# Patient Record
Sex: Male | Born: 1960
Health system: Southern US, Community
[De-identification: ages and names within clinical notes are randomized; demographics above are authoritative.]

## PROBLEM LIST (undated history)

## (undated) DIAGNOSIS — R011 Cardiac murmur, unspecified: Secondary | ICD-10-CM

## (undated) DIAGNOSIS — I4892 Unspecified atrial flutter: Secondary | ICD-10-CM

## (undated) DIAGNOSIS — I429 Cardiomyopathy, unspecified: Secondary | ICD-10-CM

## (undated) DIAGNOSIS — I509 Heart failure, unspecified: Secondary | ICD-10-CM

## (undated) DIAGNOSIS — J302 Other seasonal allergic rhinitis: Secondary | ICD-10-CM

## (undated) DIAGNOSIS — I10 Essential (primary) hypertension: Secondary | ICD-10-CM

## (undated) DIAGNOSIS — I471 Supraventricular tachycardia, unspecified: Secondary | ICD-10-CM

## (undated) HISTORY — DX: Heart failure, unspecified: I50.9

## (undated) HISTORY — DX: Supraventricular tachycardia: I47.1

## (undated) HISTORY — PX: SYNOVECTOMY: SHX133

## (undated) HISTORY — DX: Supraventricular tachycardia, unspecified: I47.10

## (undated) HISTORY — DX: Cardiomyopathy, unspecified: I42.9

## (undated) HISTORY — DX: Essential (primary) hypertension: I10

## (undated) HISTORY — DX: Other seasonal allergic rhinitis: J30.2

## (undated) HISTORY — DX: Unspecified atrial flutter: I48.92

## (undated) HISTORY — DX: Cardiac murmur, unspecified: R01.1

---

## 1998-01-26 ENCOUNTER — Ambulatory Visit (HOSPITAL_COMMUNITY): Admission: RE | Admit: 1998-01-26 | Discharge: 1998-01-26 | Payer: Self-pay | Admitting: *Deleted

## 2005-05-20 ENCOUNTER — Encounter: Admission: RE | Admit: 2005-05-20 | Discharge: 2005-05-20 | Payer: Self-pay | Admitting: Rheumatology

## 2005-06-14 ENCOUNTER — Ambulatory Visit (HOSPITAL_BASED_OUTPATIENT_CLINIC_OR_DEPARTMENT_OTHER): Admission: RE | Admit: 2005-06-14 | Discharge: 2005-06-14 | Payer: Self-pay | Admitting: Orthopedic Surgery

## 2005-06-14 ENCOUNTER — Ambulatory Visit (HOSPITAL_COMMUNITY): Admission: RE | Admit: 2005-06-14 | Discharge: 2005-06-14 | Payer: Self-pay | Admitting: Orthopedic Surgery

## 2010-12-16 ENCOUNTER — Encounter: Payer: Self-pay | Admitting: Internal Medicine

## 2010-12-16 ENCOUNTER — Encounter: Payer: Self-pay | Admitting: *Deleted

## 2010-12-19 ENCOUNTER — Encounter: Payer: Self-pay | Admitting: Internal Medicine

## 2010-12-19 ENCOUNTER — Ambulatory Visit (INDEPENDENT_AMBULATORY_CARE_PROVIDER_SITE_OTHER): Payer: PRIVATE HEALTH INSURANCE | Admitting: Internal Medicine

## 2010-12-19 DIAGNOSIS — R609 Edema, unspecified: Secondary | ICD-10-CM

## 2010-12-19 DIAGNOSIS — R0609 Other forms of dyspnea: Secondary | ICD-10-CM

## 2010-12-19 DIAGNOSIS — R0989 Other specified symptoms and signs involving the circulatory and respiratory systems: Secondary | ICD-10-CM

## 2010-12-19 DIAGNOSIS — I10 Essential (primary) hypertension: Secondary | ICD-10-CM

## 2010-12-19 NOTE — Patient Instructions (Signed)
Lab work today We will call you with results.  Your physician has requested that you have an echocardiogram. Echocardiography is a painless test that uses sound waves to create images of your heart. It provides your doctor with information about the size and shape of your heart and how well your heart's chambers and valves are working. This procedure takes approximately one hour. There are no restrictions for this procedure.   

## 2010-12-20 DIAGNOSIS — R609 Edema, unspecified: Secondary | ICD-10-CM | POA: Insufficient documentation

## 2010-12-20 DIAGNOSIS — I10 Essential (primary) hypertension: Secondary | ICD-10-CM | POA: Insufficient documentation

## 2010-12-20 LAB — CBC WITH DIFFERENTIAL/PLATELET
Basophils Absolute: 0.1 K/uL (ref 0.0–0.1)
Basophils Relative: 0.4 % (ref 0.0–3.0)
Eosinophils Absolute: 0.1 K/uL (ref 0.0–0.7)
Eosinophils Relative: 1 % (ref 0.0–5.0)
HCT: 47.8 % (ref 39.0–52.0)
Hemoglobin: 16.4 g/dL (ref 13.0–17.0)
Lymphocytes Relative: 45.3 % (ref 12.0–46.0)
Lymphs Abs: 6.1 K/uL — ABNORMAL HIGH (ref 0.7–4.0)
MCHC: 34.2 g/dL (ref 30.0–36.0)
MCV: 97.3 fl (ref 78.0–100.0)
Monocytes Absolute: 1 K/uL (ref 0.1–1.0)
Monocytes Relative: 7.8 % (ref 3.0–12.0)
Neutro Abs: 6.1 K/uL (ref 1.4–7.7)
Neutrophils Relative %: 45.5 % (ref 43.0–77.0)
Platelets: 120 K/uL — ABNORMAL LOW (ref 150.0–400.0)
RBC: 4.92 Mil/uL (ref 4.22–5.81)
RDW: 15.5 % — ABNORMAL HIGH (ref 11.5–14.6)
WBC: 13.4 K/uL — ABNORMAL HIGH (ref 4.5–10.5)

## 2010-12-20 LAB — BASIC METABOLIC PANEL WITH GFR
BUN: 15 mg/dL (ref 6–23)
CO2: 26 meq/L (ref 19–32)
Calcium: 8.6 mg/dL (ref 8.4–10.5)
Chloride: 98 meq/L (ref 96–112)
Creatinine, Ser: 1.4 mg/dL (ref 0.4–1.5)
GFR: 69.78 mL/min
Glucose, Bld: 125 mg/dL — ABNORMAL HIGH (ref 70–99)
Potassium: 4 meq/L (ref 3.5–5.1)
Sodium: 138 meq/L (ref 135–145)

## 2010-12-20 LAB — BRAIN NATRIURETIC PEPTIDE: Pro B Natriuretic peptide (BNP): 1541 pg/mL — ABNORMAL HIGH (ref 0.0–100.0)

## 2010-12-20 LAB — APTT: aPTT: 23.8 s (ref 21.7–28.8)

## 2010-12-20 LAB — PROTIME-INR
INR: 1.6 ratio — ABNORMAL HIGH (ref 0.8–1.0)
Prothrombin Time: 17 s — ABNORMAL HIGH (ref 10.2–12.4)

## 2010-12-20 NOTE — Progress Notes (Signed)
HPI Patient is a 50 year old who was referred for evaluation of SOB and edema The patient has no known cardiac problems.  He said he was doing OK until January 19th 2012.  He had been travelling to [place].  A few days after he came back home "all hell broke loose".  He couln't eat.  He had a lot of abdomenal discomfort.  Took home remedies on his own (PeptoBismal, etc)  He couldn't work out, couldn't stretch, It hurt to bend at the waist.  Seen by hopper.  Tried Omeprazole then Tums then Nexium.  Marland Kitchen  Abdomen was hurting daily, "it stuck with me".  He was not sleeping well.  Became SOB, Felt heavy in chest.  Dragging, tired.  No appetite.  Developed edema in legs.  Seen by Dr. Alwyn Ren.  Found to have an irregular HB.  Referred. Note that he was started on Lasix Thursday.  LE swelling is significantly improved. No Known Allergies  Current Outpatient Prescriptions  Medication Sig Dispense Refill  . cetirizine (ZYRTEC) 10 MG tablet Take 10 mg by mouth daily.        Marland Kitchen esomeprazole (NEXIUM) 40 MG capsule Take 40 mg by mouth daily before breakfast.        . furosemide (LASIX) 40 MG tablet Take 40 mg by mouth daily.        . metFORMIN (GLUMETZA) 500 MG (MOD) 24 hr tablet Take 500 mg by mouth 2 (two) times daily with a meal.         Past Medical History  Diagnosis Date  . CHF (congestive heart failure)   . Edema   . HTN (hypertension)     Past Surgical History  Procedure Date  . Synovectomy     No family history on file.  History   Social History  . Marital Status: Single    Spouse Name: N/A    Number of Children: N/A  . Years of Education: N/A   Occupational History  . Not on file.   Social History Main Topics  . Smoking status: Never Smoker   . Smokeless tobacco: Not on file  . Alcohol Use: Yes     occasionally  . Drug Use: No  . Sexually Active: Not on file   Other Topics Concern  . Not on file   Social History Narrative  . No narrative on file    Review of Systems:  All  systems reviewed.  They are negative to the above problem except as previously stated.  Vital Signs: BP 104/88  Pulse 113  Resp 18  Ht 6\' 2"  (1.88 m)  Wt 225 lb 1.9 oz (102.114 kg)  BMI 28.90 kg/m2  Physical Exam Patient is in NAD at rest. HEENT:  Normocephalic, atraumatic. EOMI, PERRLA.  Neck: Mild ly increased JVP.   No thyromegaly. No bruits.  Lungs:Occasional rale at bases. Heart: Regular rate and rhythm. Normal S1, S2. No S3.   No significant murmurs. PMI not displaced.  Abdomen:  Supple, nontender. Normal bowel sounds. No masses. No hepatomegaly.  Extremities:   Good distal pulses throughout. 1 to 2 + LE edema. Musculoskeletal :moving all extremities.  Neuro:   alert and oriented x3.  CN II-XII grossly intact.  EKG: Sinus tachycardia.  113 bpm.  QTc 500.  Assessment and Plan:

## 2010-12-21 ENCOUNTER — Ambulatory Visit (HOSPITAL_COMMUNITY): Payer: PRIVATE HEALTH INSURANCE | Attending: Internal Medicine | Admitting: Radiology

## 2010-12-21 DIAGNOSIS — R0989 Other specified symptoms and signs involving the circulatory and respiratory systems: Secondary | ICD-10-CM | POA: Insufficient documentation

## 2010-12-21 DIAGNOSIS — R0609 Other forms of dyspnea: Secondary | ICD-10-CM | POA: Insufficient documentation

## 2010-12-21 DIAGNOSIS — R609 Edema, unspecified: Secondary | ICD-10-CM

## 2010-12-22 ENCOUNTER — Encounter: Payer: Self-pay | Admitting: Internal Medicine

## 2010-12-26 ENCOUNTER — Telehealth: Payer: Self-pay | Admitting: Internal Medicine

## 2010-12-26 NOTE — Telephone Encounter (Signed)
See note attached to echo report 

## 2010-12-26 NOTE — Telephone Encounter (Signed)
Pt still in pain and wants the results of echo

## 2010-12-26 NOTE — Telephone Encounter (Signed)
LMOM for call back. 

## 2010-12-26 NOTE — Telephone Encounter (Signed)
Called patient with echo results.  See notes attached to the echo.

## 2010-12-27 ENCOUNTER — Telehealth: Payer: Self-pay | Admitting: Cardiology

## 2010-12-27 ENCOUNTER — Other Ambulatory Visit (INDEPENDENT_AMBULATORY_CARE_PROVIDER_SITE_OTHER): Payer: Self-pay | Admitting: *Deleted

## 2010-12-27 DIAGNOSIS — Z01810 Encounter for preprocedural cardiovascular examination: Secondary | ICD-10-CM

## 2010-12-27 DIAGNOSIS — I428 Other cardiomyopathies: Secondary | ICD-10-CM

## 2010-12-27 LAB — CBC WITH DIFFERENTIAL/PLATELET
Basophils Absolute: 0 10*3/uL (ref 0.0–0.1)
Basophils Relative: 0.4 % (ref 0.0–3.0)
Eosinophils Absolute: 0.1 10*3/uL (ref 0.0–0.7)
Eosinophils Relative: 0.6 % (ref 0.0–5.0)
HCT: 42.6 % (ref 39.0–52.0)
Hemoglobin: 14.5 g/dL (ref 13.0–17.0)
Lymphocytes Relative: 25.2 % (ref 12.0–46.0)
Lymphs Abs: 2.5 10*3/uL (ref 0.7–4.0)
MCHC: 34 g/dL (ref 30.0–36.0)
MCV: 96.3 fl (ref 78.0–100.0)
Monocytes Absolute: 1.2 10*3/uL — ABNORMAL HIGH (ref 0.1–1.0)
Monocytes Relative: 11.9 % (ref 3.0–12.0)
Neutro Abs: 6.2 10*3/uL (ref 1.4–7.7)
Neutrophils Relative %: 61.9 % (ref 43.0–77.0)
Platelets: 200 10*3/uL (ref 150.0–400.0)
RBC: 4.43 Mil/uL (ref 4.22–5.81)
RDW: 15.3 % — ABNORMAL HIGH (ref 11.5–14.6)
WBC: 10 10*3/uL (ref 4.5–10.5)

## 2010-12-27 LAB — BASIC METABOLIC PANEL
BUN: 29 mg/dL — ABNORMAL HIGH (ref 6–23)
CO2: 27 mEq/L (ref 19–32)
Calcium: 7.9 mg/dL — ABNORMAL LOW (ref 8.4–10.5)
Chloride: 99 mEq/L (ref 96–112)
Creatinine, Ser: 1.9 mg/dL — ABNORMAL HIGH (ref 0.4–1.5)
GFR: 49.55 mL/min — ABNORMAL LOW (ref >60.00)
Glucose, Bld: 138 mg/dL — ABNORMAL HIGH (ref 70–99)
Potassium: 3.7 mEq/L (ref 3.5–5.1)
Sodium: 138 mEq/L (ref 135–145)

## 2010-12-27 LAB — PROTIME-INR
INR: 1.8 ratio — ABNORMAL HIGH (ref 0.8–1.0)
Prothrombin Time: 18.9 s — ABNORMAL HIGH (ref 10.2–12.4)

## 2010-12-27 LAB — TSH: TSH: 2.82 u[IU]/mL (ref 0.35–5.50)

## 2010-12-27 LAB — BRAIN NATRIURETIC PEPTIDE: Pro B Natriuretic peptide (BNP): 1525 pg/mL — ABNORMAL HIGH (ref 0.0–100.0)

## 2010-12-27 LAB — APTT: aPTT: 24.7 s (ref 21.7–28.8)

## 2010-12-27 NOTE — Telephone Encounter (Signed)
Per Dr. Tenny Craw, cath rescheduled for Friday @ 11:30 am due to elevated creatinine. He will repeat blood work Thursday. Patient is aware of blood work & to hold Lasix.

## 2010-12-29 ENCOUNTER — Other Ambulatory Visit (INDEPENDENT_AMBULATORY_CARE_PROVIDER_SITE_OTHER): Payer: Self-pay | Admitting: Internal Medicine

## 2010-12-29 ENCOUNTER — Other Ambulatory Visit (INDEPENDENT_AMBULATORY_CARE_PROVIDER_SITE_OTHER): Payer: Self-pay | Admitting: *Deleted

## 2010-12-29 DIAGNOSIS — Z01810 Encounter for preprocedural cardiovascular examination: Secondary | ICD-10-CM

## 2010-12-29 LAB — BASIC METABOLIC PANEL
BUN: 25 mg/dL — ABNORMAL HIGH (ref 6–23)
CO2: 27 mEq/L (ref 19–32)
Calcium: 7.9 mg/dL — ABNORMAL LOW (ref 8.4–10.5)
Chloride: 98 mEq/L (ref 96–112)
Creatinine, Ser: 1.2 mg/dL (ref 0.4–1.5)
GFR: 82.67 mL/min (ref >60.00)
Glucose, Bld: 151 mg/dL — ABNORMAL HIGH (ref 70–99)
Potassium: 3.8 mEq/L (ref 3.5–5.1)
Sodium: 138 mEq/L (ref 135–145)

## 2010-12-30 ENCOUNTER — Inpatient Hospital Stay (HOSPITAL_BASED_OUTPATIENT_CLINIC_OR_DEPARTMENT_OTHER)
Admission: RE | Admit: 2010-12-30 | Discharge: 2010-12-30 | Disposition: A | Discharge status: Hospice | Payer: Self-pay | Source: Ambulatory Visit | Attending: Internal Medicine | Admitting: Internal Medicine

## 2010-12-30 ENCOUNTER — Inpatient Hospital Stay (HOSPITAL_COMMUNITY)
Admission: AD | Admit: 2010-12-30 | Discharge: 2011-01-06 | DRG: 251 | Disposition: A | Discharge status: Home / Self Care | Payer: Self-pay | Source: Other Acute Inpatient Hospital | Attending: Internal Medicine | Admitting: Internal Medicine

## 2010-12-30 ENCOUNTER — Ambulatory Visit (HOSPITAL_COMMUNITY)
Admission: RE | Admit: 2010-12-30 | Discharge: 2010-12-30 | Disposition: A | Discharge status: Home / Self Care | Payer: Self-pay | Source: Ambulatory Visit | Attending: Internal Medicine | Admitting: Internal Medicine

## 2010-12-30 DIAGNOSIS — M25469 Effusion, unspecified knee: Secondary | ICD-10-CM | POA: Diagnosis not present

## 2010-12-30 DIAGNOSIS — R000 Tachycardia, unspecified: Secondary | ICD-10-CM | POA: Insufficient documentation

## 2010-12-30 DIAGNOSIS — I509 Heart failure, unspecified: Secondary | ICD-10-CM

## 2010-12-30 DIAGNOSIS — I10 Essential (primary) hypertension: Secondary | ICD-10-CM | POA: Insufficient documentation

## 2010-12-30 DIAGNOSIS — E119 Type 2 diabetes mellitus without complications: Secondary | ICD-10-CM | POA: Diagnosis present

## 2010-12-30 DIAGNOSIS — R9389 Abnormal findings on diagnostic imaging of other specified body structures: Secondary | ICD-10-CM | POA: Insufficient documentation

## 2010-12-30 DIAGNOSIS — I059 Rheumatic mitral valve disease, unspecified: Secondary | ICD-10-CM | POA: Diagnosis present

## 2010-12-30 DIAGNOSIS — I428 Other cardiomyopathies: Secondary | ICD-10-CM | POA: Insufficient documentation

## 2010-12-30 DIAGNOSIS — I498 Other specified cardiac arrhythmias: Secondary | ICD-10-CM | POA: Diagnosis present

## 2010-12-30 DIAGNOSIS — I5023 Acute on chronic systolic (congestive) heart failure: Principal | ICD-10-CM | POA: Diagnosis present

## 2010-12-30 LAB — POCT I-STAT 3, VENOUS BLOOD GAS (G3P V)
Acid-Base Excess: 1 mmol/L (ref 0.0–2.0)
Acid-Base Excess: 1 mmol/L (ref 0.0–2.0)
Acid-Base Excess: 1 mmol/L (ref 0.0–2.0)
Bicarbonate: 25.7 mEq/L — ABNORMAL HIGH (ref 20.0–24.0)
Bicarbonate: 25.8 mEq/L — ABNORMAL HIGH (ref 20.0–24.0)
Bicarbonate: 25.3 mEq/L — ABNORMAL HIGH (ref 20.0–24.0)
O2 Saturation: 65 %
O2 Saturation: 67 %
O2 Saturation: 67 %
TCO2: 27 mmol/L (ref 0–100)
TCO2: 27 mmol/L (ref 0–100)
TCO2: 27 mmol/L (ref 0–100)
pCO2, Ven: 39.8 mmHg — ABNORMAL LOW (ref 45.0–50.0)
pCO2, Ven: 40.1 mmHg — ABNORMAL LOW (ref 45.0–50.0)
pCO2, Ven: 39.9 mmHg — ABNORMAL LOW (ref 45.0–50.0)
pH, Ven: 7.417 — ABNORMAL HIGH (ref 7.250–7.300)
pH, Ven: 7.418 — ABNORMAL HIGH (ref 7.250–7.300)
pH, Ven: 7.411 — ABNORMAL HIGH (ref 7.250–7.300)
pO2, Ven: 33 mmHg (ref 30.0–45.0)
pO2, Ven: 34 mmHg (ref 30.0–45.0)
pO2, Ven: 34 mmHg (ref 30.0–45.0)

## 2010-12-30 LAB — POCT I-STAT 3, ART BLOOD GAS (G3+)
Acid-Base Excess: 1 mmol/L (ref 0.0–2.0)
Bicarbonate: 24.5 mEq/L — ABNORMAL HIGH (ref 20.0–24.0)
O2 Saturation: 95 %
TCO2: 26 mmol/L (ref 0–100)
pCO2 arterial: 35.8 mmHg (ref 35.0–45.0)
pH, Arterial: 7.444 (ref 7.350–7.450)
pO2, Arterial: 70 mmHg — ABNORMAL LOW (ref 80.0–100.0)

## 2010-12-30 LAB — GLUCOSE, CAPILLARY
Glucose-Capillary: 181 mg/dL — ABNORMAL HIGH (ref 70–99)
Glucose-Capillary: 189 mg/dL — ABNORMAL HIGH (ref 70–99)

## 2010-12-30 LAB — MRSA PCR SCREENING: MRSA by PCR: NEGATIVE

## 2010-12-30 LAB — POCT I-STAT GLUCOSE
Glucose, Bld: 133 mg/dL — ABNORMAL HIGH (ref 70–99)
Operator id: 122531

## 2010-12-30 LAB — PROTIME-INR
INR: 1.67 — ABNORMAL HIGH (ref 0.00–1.49)
Prothrombin Time: 19.9 seconds — ABNORMAL HIGH (ref 11.6–15.2)

## 2010-12-30 NOTE — Assessment & Plan Note (Signed)
Echo with severe LV dysfunction, significant MR. Would recommend cardiac cath to rule out CAD.  (R/L).  Discussed with patient risks/benefits.  He understands and agrees to proceed.

## 2010-12-30 NOTE — Assessment & Plan Note (Signed)
BP is OK on check  Will need to be followed.

## 2010-12-31 DIAGNOSIS — I5023 Acute on chronic systolic (congestive) heart failure: Secondary | ICD-10-CM

## 2010-12-31 LAB — CBC
HCT: 39.3 % (ref 39.0–52.0)
MCH: 32 pg (ref 26.0–34.0)
MCHC: 37.9 g/dL — ABNORMAL HIGH (ref 30.0–36.0)
MCV: 84.3 fL (ref 78.0–100.0)
Platelets: 272 10*3/uL (ref 150–400)
RDW: 14.6 % (ref 11.5–15.5)
WBC: 7.8 10*3/uL (ref 4.0–10.5)

## 2010-12-31 LAB — COMPREHENSIVE METABOLIC PANEL
ALT: 85 U/L — ABNORMAL HIGH (ref 0–53)
AST: 61 U/L — ABNORMAL HIGH (ref 0–37)
Albumin: 2.2 g/dL — ABNORMAL LOW (ref 3.5–5.2)
Alkaline Phosphatase: 232 U/L — ABNORMAL HIGH (ref 39–117)
BUN: 19 mg/dL (ref 6–23)
CO2: 28 mEq/L (ref 19–32)
Calcium: 8.1 mg/dL — ABNORMAL LOW (ref 8.4–10.5)
Chloride: 100 mEq/L (ref 96–112)
Creatinine, Ser: 1.01 mg/dL (ref 0.4–1.5)
GFR calc Af Amer: >60 mL/min (ref >60)
GFR calc non Af Amer: >60 mL/min (ref >60)
Glucose, Bld: 139 mg/dL — ABNORMAL HIGH (ref 70–99)
Potassium: 3.2 mEq/L — ABNORMAL LOW (ref 3.5–5.1)
Sodium: 137 mEq/L (ref 135–145)
Total Bilirubin: 2.3 mg/dL — ABNORMAL HIGH (ref 0.3–1.2)
Total Protein: 4.8 g/dL — ABNORMAL LOW (ref 6.0–8.3)

## 2010-12-31 LAB — PRO B NATRIURETIC PEPTIDE: Pro B Natriuretic peptide (BNP): 3889 pg/mL — ABNORMAL HIGH (ref 0–125)

## 2010-12-31 LAB — GLUCOSE, CAPILLARY
Glucose-Capillary: 184 mg/dL — ABNORMAL HIGH (ref 70–99)
Glucose-Capillary: 194 mg/dL — ABNORMAL HIGH (ref 70–99)
Glucose-Capillary: 190 mg/dL — ABNORMAL HIGH (ref 70–99)

## 2010-12-31 LAB — MAGNESIUM: Magnesium: 1.7 mg/dL (ref 1.5–2.5)

## 2011-01-01 ENCOUNTER — Inpatient Hospital Stay (HOSPITAL_COMMUNITY): Payer: Self-pay

## 2011-01-01 LAB — BASIC METABOLIC PANEL
BUN: 14 mg/dL (ref 6–23)
Calcium: 8 mg/dL — ABNORMAL LOW (ref 8.4–10.5)
Creatinine, Ser: 0.74 mg/dL (ref 0.4–1.5)
GFR calc non Af Amer: >60 mL/min (ref >60)
Glucose, Bld: 124 mg/dL — ABNORMAL HIGH (ref 70–99)
Potassium: 3.6 mEq/L (ref 3.5–5.1)

## 2011-01-01 LAB — CBC
HCT: 35.9 % — ABNORMAL LOW (ref 39.0–52.0)
MCHC: 37.9 g/dL — ABNORMAL HIGH (ref 30.0–36.0)
Platelets: 271 10*3/uL (ref 150–400)
RDW: 14.6 % (ref 11.5–15.5)
WBC: 8.6 10*3/uL (ref 4.0–10.5)

## 2011-01-01 LAB — CARBOXYHEMOGLOBIN
Carboxyhemoglobin: 1.6 % — ABNORMAL HIGH (ref 0.5–1.5)
O2 Saturation: 70.8 %

## 2011-01-01 LAB — GLUCOSE, CAPILLARY: Glucose-Capillary: 202 mg/dL — ABNORMAL HIGH (ref 70–99)

## 2011-01-02 ENCOUNTER — Inpatient Hospital Stay (HOSPITAL_COMMUNITY): Payer: Self-pay

## 2011-01-02 DIAGNOSIS — I471 Supraventricular tachycardia: Secondary | ICD-10-CM

## 2011-01-02 LAB — CBC
HCT: 36.3 % — ABNORMAL LOW (ref 39.0–52.0)
MCH: 32.2 pg (ref 26.0–34.0)
MCV: 85.2 fL (ref 78.0–100.0)
RBC: 4.26 MIL/uL (ref 4.22–5.81)
WBC: 10 10*3/uL (ref 4.0–10.5)

## 2011-01-02 LAB — URINALYSIS, ROUTINE W REFLEX MICROSCOPIC
Glucose, UA: NEGATIVE mg/dL
Leukocytes, UA: NEGATIVE
Nitrite: NEGATIVE
Specific Gravity, Urine: 1.01 (ref 1.005–1.030)
pH: 6 (ref 5.0–8.0)

## 2011-01-02 LAB — GLUCOSE, CAPILLARY
Glucose-Capillary: 150 mg/dL — ABNORMAL HIGH (ref 70–99)
Glucose-Capillary: 192 mg/dL — ABNORMAL HIGH (ref 70–99)
Glucose-Capillary: 131 mg/dL — ABNORMAL HIGH (ref 70–99)

## 2011-01-02 LAB — BASIC METABOLIC PANEL
CO2: 28 mEq/L (ref 19–32)
Chloride: 101 mEq/L (ref 96–112)
Creatinine, Ser: 0.89 mg/dL (ref 0.4–1.5)
GFR calc Af Amer: >60 mL/min (ref >60)
Glucose, Bld: 138 mg/dL — ABNORMAL HIGH (ref 70–99)
Sodium: 136 mEq/L (ref 135–145)

## 2011-01-02 LAB — URINE MICROSCOPIC-ADD ON

## 2011-01-02 LAB — CARBOXYHEMOGLOBIN: Total hemoglobin: 13.8 g/dL (ref 13.5–18.0)

## 2011-01-03 LAB — BASIC METABOLIC PANEL
CO2: 30 mEq/L (ref 19–32)
Chloride: 99 mEq/L (ref 96–112)
Glucose, Bld: 134 mg/dL — ABNORMAL HIGH (ref 70–99)
Potassium: 4.4 mEq/L (ref 3.5–5.1)
Sodium: 136 mEq/L (ref 135–145)

## 2011-01-03 LAB — CARBOXYHEMOGLOBIN
Carboxyhemoglobin: 2.1 % — ABNORMAL HIGH (ref 0.5–1.5)
Methemoglobin: 0.3 % (ref 0.0–1.5)
O2 Saturation: 75.4 %

## 2011-01-03 LAB — GLUCOSE, CAPILLARY
Glucose-Capillary: 220 mg/dL — ABNORMAL HIGH (ref 70–99)
Glucose-Capillary: 249 mg/dL — ABNORMAL HIGH (ref 70–99)

## 2011-01-04 LAB — GLUCOSE, CAPILLARY
Glucose-Capillary: 130 mg/dL — ABNORMAL HIGH (ref 70–99)
Glucose-Capillary: 318 mg/dL — ABNORMAL HIGH (ref 70–99)

## 2011-01-04 LAB — BASIC METABOLIC PANEL
BUN: 16 mg/dL (ref 6–23)
Chloride: 99 mEq/L (ref 96–112)
Glucose, Bld: 149 mg/dL — ABNORMAL HIGH (ref 70–99)
Potassium: 4.6 mEq/L (ref 3.5–5.1)

## 2011-01-04 LAB — CARBOXYHEMOGLOBIN: Methemoglobin: 0.8 % (ref 0.0–1.5)

## 2011-01-04 LAB — URIC ACID: Uric Acid, Serum: 7.3 mg/dL (ref 4.0–7.8)

## 2011-01-04 NOTE — Cardiovascular Report (Signed)
NAMEKWESI, John Pham              ACCOUNT NO.:  [id number]  MEDICAL RECORD NO.:  [id number]           PATIENT TYPE:  O  LOCATION:  EKG                          FACILITY:  [facility]  PHYSICIAN:  Bevelyn Buckles. Celine Dishman, MDDATE OF BIRTH:  05-Sep-1960  DATE OF PROCEDURE:  12/30/2010 DATE OF DISCHARGE:                           CARDIAC CATHETERIZATION   PRIMARY CARE PHYSICIAN:  Titus Dubin. Alwyn Ren, MD, FACP, FCCP  CARDIOLOGIST:  Pricilla Riffle, MD, [facility]  PATIENT IDENTIFICATION:  John Pham is a 50 year old male with a history of hypertension and diabetes.  He has had progressive heart failure symptoms over the past few weeks.  Echocardiogram showed significant biventricular dysfunction with LV ejection fraction of 25%.  He is brought in today for an outpatient cardiac catheterization.  PROCEDURES PERFORMED: 1. Right heart catheterization. 2..  Selective coronary angiography. 1. Left heart catheterization. 2. Left ventriculogram.  DESCRIPTION OF PROCEDURE:  The risks and indications were explained. Consent was signed and placed on the chart.  At the beginning of procedure, the patient was noted to have a heart rate of 145 beats per minute.  It was regular.  Prior to prepping him for the catheterization, we got 12-lead EKG and a rhythm strip and appeared to be a sinus tach versus atrial tachycardia.  We gave him 6 mg of adenosine with no effect.  We then repeated with 12 mg adenosine and still no effect.  I have reviewed the tracings with Dr. Johney Frame who agreed that it is likely sinus tachycardia versus atrial tachycardia. We then proceeded with the catheterization.  The right groin area was prepped and draped in a routine sterile fashion, anesthetized 1% local lidocaine.  A 4-French arterial sheath and a 7-French venous sheath were placed.  Standard catheters including JL-4, 3-DRC, angled pigtail, and a Swan-Ganz catheter were used.  There were no apparent complications.  All catheter  exchanges were made over a wire.  HEMODYNAMICS:  Right atrial pressure mean of 11, RV pressure 38/3 with an EDP of 2.  PA pressure 39/25 with a mean of 31.  Pulmonary capillary wedge pressure mean of 16.  Fick cardiac output was 5.0 and cardiac index was 2.2.  Thermodilution cardiac output was 3.4, index was 1.5. Central aortic pressure 98/84 with a mean of 90.  LV pressure 103/20 with an EDP of 20.  There was no aortic stenosis.  Pulmonary vascular resistance was 4.5 Woods units.  Pulmonary artery saturation was 65 and 67%.  Femoral artery saturation was 95%.  SVC saturation was 67%.  Left main was normal.  LAD was a moderate-sized vessel, gave off a small proximal diagonal and large mid diagonal and it was angiographically normal.  Left circumflex was a large codominant vessel, gave off a large OM-1, small OM-2, and two posterolaterals and question of a small PDA.  Right coronary artery was a large dominant vessel, gave off a PDA and a posterolateral and was angiographically normal.  Left ventriculogram done in the RAO position showed a markedly dilated ventricle with an EF of 10%.  There was severe global hypokinesis.  ASSESSMENT: 1. Severe nonischemic cardiomyopathy with low output heart failure,  but based on thermodilution. 2. Significant tachycardia.  I suspect this is sinus tachycardia     versus atrial tachycardia.  I have reviewed this with Dr. Johney Frame.     He did not have any response to adenosine. 3. No evidence of intracardiac shunting. 4. Normal coronary arteries.  PLAN/DISCUSSION:  As above, I think Mr. Sonnenberg tachycardia is reactive, sinus tach secondary to low output heart failure, but I cannot exclude a tachycardic-induced cardiomyopathy secondary to atrial tach with persistently increased heart rates.  At this point, we will admit him to 2900.  We will start milrinone and digoxin as well as low-dose ACE inhibitor.  Over the next day or two, proceed  with gentle initiation of beta-blocker.  We may need EP to see him if his heart rate is not decreasing.  We would hold metformin for now.     Bevelyn Buckles. Lin Hackmann, MD     DRB/MEDQ  D:  12/30/2010  T:  12/30/2010  Job:  [id number]  Electronically Signed by Arvilla Meres MD on 01/04/2011 09:41:58 PM

## 2011-01-05 ENCOUNTER — Inpatient Hospital Stay (HOSPITAL_COMMUNITY): Payer: Self-pay

## 2011-01-05 LAB — BASIC METABOLIC PANEL
CO2: 31 mEq/L (ref 19–32)
Chloride: 98 mEq/L (ref 96–112)
Glucose, Bld: 136 mg/dL — ABNORMAL HIGH (ref 70–99)
Potassium: 4.1 mEq/L (ref 3.5–5.1)
Sodium: 135 mEq/L (ref 135–145)

## 2011-01-05 LAB — GLUCOSE, CAPILLARY
Glucose-Capillary: 311 mg/dL — ABNORMAL HIGH (ref 70–99)
Glucose-Capillary: 250 mg/dL — ABNORMAL HIGH (ref 70–99)
Glucose-Capillary: 296 mg/dL — ABNORMAL HIGH (ref 70–99)

## 2011-01-05 LAB — CARBOXYHEMOGLOBIN: O2 Saturation: 56.5 %

## 2011-01-05 NOTE — Op Note (Signed)
NAMEIZZY, COURVILLE              ACCOUNT NO.:  [id number]  MEDICAL RECORD NO.:  [id number]           PATIENT TYPE:  I  LOCATION:  2917                         FACILITY:  [facility]  PHYSICIAN:  Doylene Canning. Ladona Ridgel, MD    DATE OF BIRTH:  11-May-1961  DATE OF PROCEDURE:  01/02/2011 DATE OF DISCHARGE:                              OPERATIVE REPORT   PROCEDURE PERFORMED:  Electrophysiologic study and RF catheter ablation of multiple supraventricular tachycardias including 2 high right atrial tachycardias, as well as atrioventricular nodal reentrant tachycardia.  INTRODUCTION:  The patient is a 50 year old man who was found to have congestive heart failure and a nonischemic cardiomyopathy questionable tachycardia mediated.  He has had recurrent episodes of SVT at rates between 120-150 beats per minute.  Typically, these appeared to have been a long RP tachycardia.  He is referred now for catheter ablation.  PROCEDURE:  After informed consent was obtained, the patient was taken to the Diagnostic EP lab in a fasting state.  After usual preparation and draping, intravenous fentanyl and midazolam was given for sedation. A 6-French hexapolar catheter was inserted percutaneously into the right jugular vein and advanced to coronary sinus.  A 5-French quadripolar catheter was inserted percutaneously in the right femoral vein and advanced to His bundle region.  A 5-French quadripolar catheter was inserted percutaneously in the right femoral vein and advanced to right ventricle.  Mapping was carried out.  With catheter manipulation, the patient did go into SVT at a rate of 120 beats per minute.  This SVT was mapped to appear to be coming from the high right atrium.  It was started and stopped suddenly.  At other times, the patient's SVT would transition to a faster SVT but coming from the same general area.  At this point, the 6-French quadripolar catheter in the right ventricle was removed and a  8-French 20-pole halo catheter was inserted into the right femoral vein and advanced under fluoroscopic guidance to the right atrium.  Additional mapping was carried out.  This demonstrated that the earliest atrial activation appeared to be occurring in the high right atrium.  The ablation catheter was then maneuvered into the lateral portion of the high right atrium.  With manipulation of the catheter, the patient's atrial tach would become quiet.  At this point, a rapid atrial pacing was carried out resulting in the initiation of atrial tachycardia originating from the high right atrium at a rate of about 145 beats per minute.  The earliest atrial activation was seen below the junction of the high right atrium SVC and on the lateral side of right atrium.  Two RF energy applications delivered at this region resulting in termination of that particular tachycardia.  Mapping was then carried out over on the septal side of the high right atrium and three RF energy applications were delivered in this location to an SVT at a rate of 120 beats per minute.  Following ablation, there was no recurrent atrial tachycardia.  At this point, additional rapid atrial pacing was carried out from the right atrium, right ventricle, with rapid atrial pacing and programmed atrial stimulation  along with rapid ventricular pacing and programmed ventricular stimulation.  This resulted in another SVT at a rate of 120 beats per minute.  This SVT appeared to be originating from the septal portion of the high right atrium and three RF energy applications were delivered to this region resulting in termination of the patient's high right atrial SVT.  Additional rapid atrial pacing was then carried out from the right atrium and the coronary sinus resulting in the induction of AV nodal reentrant tachycardia.  This was manifest by a VA interval that was essentially zero.  Mapping was not carried out because of this  tachycardia.  The patient had previously been known to have jumps in echo beats, however.  Three RF energy applications were delivered at sites 5 through 9 Koch triangle resulting in no inducible SVT.  At this point, the catheters were removed.  Hemostasis was assured and the patient was returned to his room in satisfactory condition.  COMPLICATIONS:  There were no immediate procedure complications.  RESULTS:  A.  Baseline ECG.  The baseline ECG demonstrates sinus tachycardia with normal axis intervals. B.  Baseline intervals:  Sinus node cycle length was 570 milliseconds. The QRS duration was 103 milliseconds.  The HV interval was 56 milliseconds. C.  Rapid ventricular pacing.  Rapid ventricular pacing carried out from right ventricle demonstrating VA conduction at 150 beats per minute. D.  Programmed ventricular stimulation.  Programmed ventricular stimulation was carried out from the right ventricle at a pacing cycle length of 400 milliseconds demonstrating VA association. E.  Rapid atrial pacing.  Rapid atrial pacing was carried out from the coronary sinus and high right atrium at multiple locations in multiple sites. F.  Rapid atrial pacing.  Rapid atrial pacing was carried out from the high right atrium and coronary sinus, the pacing cycle length was 500 milliseconds and stepwise decreased down to 210 milliseconds where atrial refractoriness was observed.  During rapid atrial pacing, the PR interval was less than the RR interval, however, following ablation of the atrial tachycardia the PR interval was equal to the RR interval. G.  Arrhythmias observed. 1. Atrial tach initiating from the high lateral right atrium. 2. Atrial tach originates from the high septal right atrium. 3. AV nodal reentrant tachycardia initiating from the [place] triangle     site 2 through 7.     a.     Mapping:  Mapping of the patient's right atrium demonstrated      a much larger than usual atrium with  normal orientation.     b.     RF energy application.  A total of 2 RF energy applications      were initially delivered to the high right atrial tachycardia and      lateral wall, three RF energy applications were delivered to the      high right atrial tachycardia and septal wall and three RF energy      applications were delivered to sites 5 through 9 at Koch's      triangle.  Following this, there was no inducible arrhythmias.     Doylene Canning. Ladona Ridgel, MD     GWT/MEDQ  D:  01/02/2011  T:  01/03/2011  Job:  [id number]  Electronically Signed by Lewayne Bunting MD on 01/05/2011 12:54:52 PM

## 2011-01-06 LAB — BASIC METABOLIC PANEL
CO2: 33 mEq/L — ABNORMAL HIGH (ref 19–32)
Calcium: 9.1 mg/dL (ref 8.4–10.5)
GFR calc Af Amer: >60 mL/min (ref >60)
GFR calc non Af Amer: >60 mL/min (ref >60)
Glucose, Bld: 162 mg/dL — ABNORMAL HIGH (ref 70–99)
Potassium: 4.4 mEq/L (ref 3.5–5.1)
Sodium: 138 mEq/L (ref 135–145)

## 2011-01-09 LAB — CULTURE, BLOOD (ROUTINE X 2)
Culture  Setup Time: 201205220230
Culture: NO GROWTH
Culture: NO GROWTH

## 2011-01-10 ENCOUNTER — Other Ambulatory Visit (INDEPENDENT_AMBULATORY_CARE_PROVIDER_SITE_OTHER): Payer: Self-pay | Admitting: *Deleted

## 2011-01-10 ENCOUNTER — Ambulatory Visit (INDEPENDENT_AMBULATORY_CARE_PROVIDER_SITE_OTHER): Payer: Self-pay | Admitting: Internal Medicine

## 2011-01-10 VITALS — BP 96/70 | HR 92 | Ht 74.0 in | Wt 212.4 lb

## 2011-01-10 DIAGNOSIS — I498 Other specified cardiac arrhythmias: Secondary | ICD-10-CM

## 2011-01-10 DIAGNOSIS — I5022 Chronic systolic (congestive) heart failure: Secondary | ICD-10-CM

## 2011-01-10 DIAGNOSIS — I471 Supraventricular tachycardia: Secondary | ICD-10-CM

## 2011-01-10 DIAGNOSIS — R0989 Other specified symptoms and signs involving the circulatory and respiratory systems: Secondary | ICD-10-CM

## 2011-01-10 LAB — BASIC METABOLIC PANEL
Calcium: 9.3 mg/dL (ref 8.4–10.5)
GFR: 126.49 mL/min (ref >60.00)
Glucose, Bld: 107 mg/dL — ABNORMAL HIGH (ref 70–99)
Potassium: 4.5 mEq/L (ref 3.5–5.1)
Sodium: 140 mEq/L (ref 135–145)

## 2011-01-10 LAB — BRAIN NATRIURETIC PEPTIDE: Pro B Natriuretic peptide (BNP): 792 pg/mL — ABNORMAL HIGH (ref 0.0–100.0)

## 2011-01-10 MED ORDER — CARVEDILOL 6.25 MG PO TABS: ORAL_TABLET | ORAL | Status: DC

## 2011-01-10 MED ORDER — FUROSEMIDE 40 MG PO TABS: ORAL_TABLET | ORAL | Status: DC

## 2011-01-10 NOTE — Patient Instructions (Signed)
Your physician recommends that you return for lab work on June 5,2012 for a bmet, and bnp Your physician recommends that you schedule a follow-up appointment in: 2 weeks with Dr. Gala Romney Wear below the knee compression hose. Your physician recommends that you weigh, daily, at the same time every day, and in the same amount of clothing. Please record your daily weights on the handout provided and bring it to your next appointment.   Your physician has recommended you make the following change in your medication: INCREASE Carvedilol to 1 and 1/2 tablets twice a day.   DECREASE  Furosemide to once a day

## 2011-01-11 NOTE — Discharge Summary (Signed)
John Pham, John Pham              ACCOUNT NO.:  [id number]  MEDICAL RECORD NO.:  [id number]           PATIENT TYPE:  I  LOCATION:  2917                         FACILITY:  [facility]  PHYSICIAN:  Bevelyn Buckles. Brisha Mccabe, MDDATE OF BIRTH:  03/27/1961  DATE OF ADMISSION:  12/30/2010 DATE OF DISCHARGE:  01/06/2011                              DISCHARGE SUMMARY   DISCHARGE DIAGNOSES: 1. Acute on chronic systolic heart failure. 2. Atrial tachycardia status post radiofrequency ablation x3 this     admission. 3. Right knee effusion status post aspiration and steroid injections. 4. Nonischemic cardiomyopathy, thought to be tachycardic induced with     ejection fraction around 15%.  BRIEF HOSPITAL COURSE:  John Pham is a very pleasant 50 year old male with a history of hypertension and borderline diabetes.  John Pham was referred to Dr. Tenny Craw by Dr. Lona Kettle for progressive heart failure symptoms. Echocardiogram showed significant biventricular dysfunction with a left ventricular ejection fraction of around 25%.  She referred him for cardiac catheterization.  John Pham presented for outpatient catheterization on Dec 30, 2010.  When we brought him on to the cath lab table, John Pham had a heart rate of 145 which looked to be sinus tachycardia versus an atrial tachycardia.  We gave him several rounds of adenosine with no effect.  John Pham then underwent cardiac catheterization.  This showed normal coronary arteries with ejection fraction about 10-15%.  Right heart cath showed an RA pressure of 11 and RV pressure of 38/3 with an EDP of 2, a PA pressure of 39/25 with a mean of 31, a wedge pressure of 16.  Cardiac output was 5.0.  Cardiac index was 2.2 by Fick. By thermodilution, it was 3.4 and 1.5.  Pulmonary artery saturations were 65% and 67%.  Based on these findings, John Pham was admitted to the step- down unit for medication titration and treatment of his heart failure. John Pham did well with initiation of an ACE inhibitor as  well as beta-blocker. While on telemetry, we caught several runs of atrial tachycardia.  John Pham was evaluated by Dr. Lewayne Bunting, Electrophysiology and on May 22 underwent ablation of 2 high right atrial tachycardias as well as an AV nodal reentrant tachycardia.  This was successful.  Throughout the hospitalization, his baseline heart rate trended down from 105 to about 92 on discharge.  John Pham did well with diuresis using IV Lasix.  His hospitalization was complicated by severe right knee pain and joint effusion.  Initially we thought this was gout, however John Pham did not respond to prednisone.  John Pham was seen by Dr. Allie Bossier in Orthopedics who aspirated the joint and provided a steroid injection with significant relief.  At the time of discharge, his CVP was 4.  His weight was 221.8 pounds or 100.6 kg.  His creatinine was 0.58 with a potassium of 4.4.  His co-ox was 64%.  MEDICATIONS ON DISCHARGE: 1. Lasix 80 mg in the morning and 40 mg at night to be titrated as     needed based on his weight. 2. John Pham will also take lisinopril 10 mg b.i.d. 3. Coreg 6.25 mg b.i.d. 4. Lanoxin 0.25 mg a day.  5. Spironolactone 25 mg a day. 6. We are stopping his metformin due to his heart failure.  We will     switch him over to Amaryl 4 mg a day. 7. John Pham will also continue on his Zyrtec 10 mg a day.  FOLLOWUP: 1. John Pham will follow up with me in the office next week.  I will get him     an appointment. 2. John Pham will get a BMET next week to make sure his renal function and     potassium are stable.  I have instructed him and his fiancee on the use of sliding scale Lasix.  Total time of discharge encounter is 35 minutes.     Bevelyn Buckles. Manaia Samad, MD     DRB/MEDQ  D:  01/06/2011  T:  01/06/2011  Job:  [id number]  Electronically Signed by Arvilla Meres MD on 01/11/2011 10:43:41 PM

## 2011-01-11 NOTE — Discharge Summary (Signed)
  NAMEURI, TURNBOUGH              ACCOUNT NO.:  [id number]  MEDICAL RECORD NO.:  [id number]           PATIENT TYPE:  I  LOCATION:  2917                         FACILITY:  [facility]  PHYSICIAN:  Bevelyn Buckles. Jasten Guyette, MDDATE OF BIRTH:  10-31-1960  DATE OF ADMISSION:  12/30/2010 DATE OF DISCHARGE:  01/06/2011                              DISCHARGE SUMMARY   ADDENDUM.  Nexium 40 mg a day.  Follow up would be with me on Tuesday, Jan 10, 2011, at 9:45 a.m.     Bevelyn Buckles. Likisha Alles, MD     DRB/MEDQ  D:  01/06/2011  T:  01/06/2011  Job:  [id number]  Electronically Signed by Arvilla Meres MD on 01/11/2011 10:43:44 PM

## 2011-01-12 ENCOUNTER — Encounter: Payer: Self-pay | Admitting: Internal Medicine

## 2011-01-12 DIAGNOSIS — I471 Supraventricular tachycardia: Secondary | ICD-10-CM | POA: Insufficient documentation

## 2011-01-12 DIAGNOSIS — I5022 Chronic systolic (congestive) heart failure: Secondary | ICD-10-CM | POA: Insufficient documentation

## 2011-01-12 NOTE — Assessment & Plan Note (Signed)
Maintaining SR s/p ablation.

## 2011-01-12 NOTE — Assessment & Plan Note (Signed)
Symptomatically much improved. NYHA II. Volume status improving with very rapid weight drop but still with edema on board. Will drop lasix back to once a day. Increase carvedilol. Check labs today.

## 2011-01-12 NOTE — Progress Notes (Signed)
HPI:  John Pham is a 50 y/o male with a h/o GERD and HTN who was recently admitted for low output HF with an EF 25%.   Underwent JV cath 2 weeks ago with no significant CAD but EF ~15-20%. Found to have atrial tach. Underwent ablation by Dr. Ladona Ridgel of 2 atrial tachs and 1 AVNRT. Cardiomyopathy felt to be tachy induced. Diureses and started on appropriate HF meds. D/c'd home last week.  Returns for f/u. Now feels MUCH better. Has lost another 15 pounds in fluid since last week. He's worried he is wasting away. Breathing much better. Able to walk quickly with no significant difficulty. No orthopnea or PND. No palpitations. Taking meds as prescribed.     ROS: All systems negative except as listed in HPI, PMH and Problem List.  Past Medical History  Diagnosis Date  . CHF (congestive heart failure)   . Edema   . HTN (hypertension)     Current Outpatient Prescriptions  Medication Sig Dispense Refill  . carvedilol (COREG) 6.25 MG tablet 1 and 1/2 tablets twice a day   90 tablet  6  . cetirizine (ZYRTEC) 10 MG tablet Take 10 mg by mouth daily.        . digoxin (LANOXIN) 0.25 MG tablet Take 250 mcg by mouth daily.        Marland Kitchen esomeprazole (NEXIUM) 40 MG capsule Take 40 mg by mouth daily before breakfast.        . furosemide (LASIX) 40 MG tablet 1 tablet daily  30 tablet  6  . lisinopril (PRINIVIL,ZESTRIL) 10 MG tablet Take 10 mg by mouth daily. 1 tablet twice a day       . spironolactone (ALDACTONE) 25 MG tablet Take 25 mg by mouth daily.        . metFORMIN (GLUMETZA) 500 MG (MOD) 24 hr tablet Take 500 mg by mouth 2 (two) times daily with a meal.          PHYSICAL EXAM: Filed Vitals:   01/10/11 1333  BP: 96/70  Pulse: 92   General:  Well appearing. No resp difficulty HEENT: normal Neck: supple. JVP flat. Carotids 2+ bilaterally; no bruits. No lymphadenopathy or thryomegaly appreciated. Cor: PMI laterally displaced.. Regular rate & rhythm. No rubs, gallops or murmurs. Lungs: clear Abdomen:  soft, nontender, nondistended. No hepatosplenomegaly. No bruits or masses. Good bowel sounds. Extremities: no cyanosis, clubbing, rash, 2-3+ edema Neuro: alert & orientedx3, cranial nerves grossly intact. Moves all 4 extremities w/o difficulty. Affect pleasant.  ECG: SR 92. No ST-T wave abnormalities.    ASSESSMENT & PLAN:

## 2011-01-17 ENCOUNTER — Other Ambulatory Visit (INDEPENDENT_AMBULATORY_CARE_PROVIDER_SITE_OTHER): Payer: Self-pay | Admitting: *Deleted

## 2011-01-17 DIAGNOSIS — I5022 Chronic systolic (congestive) heart failure: Secondary | ICD-10-CM

## 2011-01-17 LAB — BASIC METABOLIC PANEL
CO2: 28 mEq/L (ref 19–32)
Chloride: 98 mEq/L (ref 96–112)
Creatinine, Ser: 0.9 mg/dL (ref 0.4–1.5)
Potassium: 4.7 mEq/L (ref 3.5–5.1)

## 2011-01-18 LAB — BRAIN NATRIURETIC PEPTIDE: Pro B Natriuretic peptide (BNP): 251 pg/mL — ABNORMAL HIGH (ref 0.0–100.0)

## 2011-01-24 ENCOUNTER — Encounter: Payer: Self-pay | Admitting: Internal Medicine

## 2011-01-25 ENCOUNTER — Encounter: Payer: Self-pay | Admitting: Internal Medicine

## 2011-01-25 ENCOUNTER — Ambulatory Visit (INDEPENDENT_AMBULATORY_CARE_PROVIDER_SITE_OTHER): Payer: Self-pay | Admitting: Internal Medicine

## 2011-01-25 VITALS — BP 104/72 | HR 102 | Ht 74.0 in | Wt 199.8 lb

## 2011-01-25 DIAGNOSIS — I5022 Chronic systolic (congestive) heart failure: Secondary | ICD-10-CM

## 2011-01-25 DIAGNOSIS — I471 Supraventricular tachycardia: Secondary | ICD-10-CM

## 2011-01-25 DIAGNOSIS — I498 Other specified cardiac arrhythmias: Secondary | ICD-10-CM

## 2011-01-25 MED ORDER — CARVEDILOL 12.5 MG PO TABS: 12.5000 mg | ORAL_TABLET | Freq: Two times a day (BID) | ORAL | Status: DC

## 2011-01-25 NOTE — Progress Notes (Signed)
HPI:  John Pham is a 50 y/o male with a h/o GERD and HTN who was recently admitted for low output HF with an EF 25%.   Underwent JV cath 2 weeks ago with no significant CAD but EF ~15-20%. Found to have atrial tach. Underwent ablation by Dr. Ladona Ridgel of 2 atrial tachs and 1 AVNRT. Cardiomyopathy felt to be tachy induced. Diureses and started on appropriate HF meds. Returns for f/u. Now feels MUCH better. Has lost another 15 pounds in fluid since last week. He's worried he is wasting away. Breathing much better. Able to walk quickly with no significant difficulty. No orthopnea or PND. No palpitations. Taking meds as prescribed.   At last visit we cut back lasix and increased carvedilol. Feels much better. Was having problem with arthritis flare so took a course of prednisone. Now wearing compression hose. Edema much improved. No orthopnea or PND. BP 110-115/70 range. Weigh back up slightly but now maintaining.   Labs stable BNP down to 251.   I did quick look echo today and EF 35-40%    ROS: All systems negative except as listed in HPI, PMH and Problem List.  Past Medical History  Diagnosis Date  . CHF (congestive heart failure)   . Edema   . HTN (hypertension)     Current Outpatient Prescriptions  Medication Sig Dispense Refill  . carvedilol (COREG) 6.25 MG tablet 1 and 1/2 tablets twice a day   90 tablet  6  . cetirizine (ZYRTEC) 10 MG tablet Take 10 mg by mouth daily.        . digoxin (LANOXIN) 0.25 MG tablet Take 250 mcg by mouth daily.        Marland Kitchen esomeprazole (NEXIUM) 40 MG capsule Take 40 mg by mouth daily before breakfast.        . furosemide (LASIX) 40 MG tablet 1 tablet daily  30 tablet  6  . lisinopril (PRINIVIL,ZESTRIL) 10 MG tablet Take 10 mg by mouth daily. 1 tablet twice a day       . metFORMIN (GLUMETZA) 500 MG (MOD) 24 hr tablet Take 500 mg by mouth 2 (two) times daily with a meal.       . predniSONE (DELTASONE) 20 MG tablet Take 10 mg by mouth daily.        Marland Kitchen spironolactone  (ALDACTONE) 25 MG tablet Take 25 mg by mouth daily.           PHYSICAL EXAM: Filed Vitals:   01/25/11 1137  BP: 104/72  Pulse: 102   General:  Well appearing. No resp difficulty HEENT: normal Neck: supple. JVP flat. Carotids 2+ bilaterally; no bruits. No lymphadenopathy or thryomegaly appreciated. Cor: PMI laterally displaced.. Regular rate & rhythm. No rubs, gallops or murmurs. Lungs: clear Abdomen: soft, nontender, nondistended. No hepatosplenomegaly. No bruits or masses. Good bowel sounds. Extremities: no cyanosis, clubbing, rash, + compression hose no edema Neuro: alert & orientedx3, cranial nerves grossly intact. Moves all 4 extremities w/o difficulty. Affect pleasant.  ECG: SR 92. No ST-T wave abnormalities.    ASSESSMENT & PLAN:

## 2011-01-25 NOTE — Patient Instructions (Signed)
Increase Carvedilol to 12.5 mg Twice daily   Your physician recommends that you schedule a follow-up appointment in: 1 month  

## 2011-01-25 NOTE — Assessment & Plan Note (Signed)
Looks much better. EF improving after ablation. Increase carvedilol to 12.5 bid. Volume status looks good. Knows how to use sliding scale lasix. See back in 1 month.

## 2011-01-25 NOTE — Assessment & Plan Note (Signed)
Quiescent s/p ablation

## 2011-02-01 ENCOUNTER — Other Ambulatory Visit: Payer: Self-pay | Admitting: Internal Medicine

## 2011-02-08 ENCOUNTER — Other Ambulatory Visit: Payer: Self-pay | Admitting: Internal Medicine

## 2011-03-14 ENCOUNTER — Encounter: Payer: Self-pay | Admitting: *Deleted

## 2011-03-14 ENCOUNTER — Ambulatory Visit (INDEPENDENT_AMBULATORY_CARE_PROVIDER_SITE_OTHER): Payer: Self-pay | Admitting: Internal Medicine

## 2011-03-14 VITALS — BP 110/70 | HR 130 | Ht 74.0 in | Wt 219.0 lb

## 2011-03-14 DIAGNOSIS — I5022 Chronic systolic (congestive) heart failure: Secondary | ICD-10-CM

## 2011-03-14 DIAGNOSIS — I4892 Unspecified atrial flutter: Secondary | ICD-10-CM

## 2011-03-14 DIAGNOSIS — M109 Gout, unspecified: Secondary | ICD-10-CM

## 2011-03-14 LAB — CBC WITH DIFFERENTIAL/PLATELET
Basophils Relative: 0.5 % (ref 0.0–3.0)
Eosinophils Relative: 0.5 % (ref 0.0–5.0)
HCT: 40.5 % (ref 39.0–52.0)
Hemoglobin: 13.6 g/dL (ref 13.0–17.0)
Lymphs Abs: 3.2 10*3/uL (ref 0.7–4.0)
MCV: 95.6 fl (ref 78.0–100.0)
Monocytes Absolute: 0.7 10*3/uL (ref 0.1–1.0)
Monocytes Relative: 6.2 % (ref 3.0–12.0)
Neutro Abs: 7 10*3/uL (ref 1.4–7.7)
RBC: 4.24 Mil/uL (ref 4.22–5.81)
WBC: 11 10*3/uL — ABNORMAL HIGH (ref 4.5–10.5)

## 2011-03-14 LAB — BASIC METABOLIC PANEL
BUN: 24 mg/dL — ABNORMAL HIGH (ref 6–23)
CO2: 25 mEq/L (ref 19–32)
Calcium: 9.1 mg/dL (ref 8.4–10.5)
Creatinine, Ser: 1.1 mg/dL (ref 0.4–1.5)
GFR: 90.38 mL/min (ref >60.00)
Glucose, Bld: 127 mg/dL — ABNORMAL HIGH (ref 70–99)
Sodium: 139 mEq/L (ref 135–145)

## 2011-03-14 LAB — T3, FREE: T3, Free: 2.8 pg/mL (ref 2.3–4.2)

## 2011-03-14 MED ORDER — FUROSEMIDE 40 MG PO TABS: 40.0000 mg | ORAL_TABLET | Freq: Two times a day (BID) | ORAL | Status: DC

## 2011-03-14 MED ORDER — WARFARIN SODIUM 5 MG PO TABS: 7.5000 mg | ORAL_TABLET | Freq: Every day | ORAL | Status: DC

## 2011-03-14 NOTE — Patient Instructions (Addendum)
Increase Furosemide to 40 mg Twice daily  Start Coumadin 5 mg 1 & 1/2 tabs daily   Labs today  You have been referred to Coumadin clinic on Fri 8/3 at 2:30  Your physician recommends that you return for lab work on Fri 8/3 at [employer] have been referred to Dr Dareen Piano for gout  You have been referred to Dr Ladona Ridgel on Aug 21 at 10:45 am  Your physician has requested that you have a TEE/Cardioversion. During a TEE, sound waves are used to create images of your heart. It provides your doctor with information about the size and shape of your heart and how well your heart's chambers and valves are working. In this test, a transducer is attached to the end of a flexible tube that is guided down you throat and into your esophagus (the tube leading from your mouth to your stomach) to get a more detailed image of your heart. Once the TEE has determined that a blood clot is not present, the cardioversion begins. Electrical Cardioversion uses a jolt of electricity to your heart either through paddles or wired patches attached to your chest. This is a controlled, usually prescheduled, procedure. This procedure is done at the hospital and you are not awake during the procedure. You usually go home the day of the procedure. Please see the instruction sheet given to you today for more information.

## 2011-03-14 NOTE — Progress Notes (Signed)
HPI:  John Pham is a 50 y/o male with a h/o GERD and HTN who was admitted in May 2012 for low output HF with an EF 25%.   Underwent JV cath in May 2012 with no significant CAD but EF ~15-20%. Found to have atrial tach. Underwent ablation by Dr. Ladona Ridgel of 2 atrial tachs and 1 AVNRT. Cardiomyopathy felt to be tachy induced. Diureses and started on appropriate HF meds.   Returns for f/u. At last visit feeling much better. I did I quick look echo and EF 35-40%. Carvedilol increased to 12.5 bid.  Continues to improve but not back to 100%. Working out most days per week with weights and some aerobics with John Pham and ab work. Weight up and down ranges from 195 to 221. However over last 2 weeks weight going up and much more fatigue.Denies DOE. No ankle edema. + occasional dry cough. No orthopnea/PND. SBP 95-100.  No palpitations Still taking prednisone for knee pain/arthritis.   Taking [employer] vita-pak including Burn-60 stimulant with black tea extract.   ROS: All systems negative except as listed in HPI, PMH and Problem List.  Past Medical History  Diagnosis Date  . CHF (congestive heart failure)   . Edema   . HTN (hypertension)     Current Outpatient Prescriptions  Medication Sig Dispense Refill  . carvedilol (COREG) 12.5 MG tablet Take 1 tablet (12.5 mg total) by mouth 2 (two) times daily with a meal.  60 tablet  6  . cetirizine (ZYRTEC) 10 MG tablet Take 10 mg by mouth daily.        Marland Kitchen esomeprazole (NEXIUM) 40 MG capsule Take 40 mg by mouth as needed.       . furosemide (LASIX) 40 MG tablet 1 tablet daily  30 tablet  6  . glimepiride (AMARYL) 4 MG tablet Take 4 mg by mouth daily before breakfast.        . lisinopril (PRINIVIL,ZESTRIL) 10 MG tablet Take 10 mg by mouth daily. 1 tablet twice a day       . predniSONE (DELTASONE) 20 MG tablet Take 10 mg by mouth daily.        . digoxin (LANOXIN) 0.25 MG tablet Take 250 mcg by mouth daily.        . metFORMIN (GLUMETZA) 500 MG (MOD) 24 hr tablet Take  500 mg by mouth 2 (two) times daily with a meal.       . spironolactone (ALDACTONE) 25 MG tablet Take 25 mg by mouth daily.           PHYSICAL EXAM: Filed Vitals:   03/14/11 1023  Pulse: 72   General:  Well appearing. No resp difficulty HEENT: normal except mild exopthalmos. Neck: supple. JVP 6-7. Carotids 2+ bilaterally; no bruits. No lymphadenopathy or thryomegaly appreciated. Cor: PMI laterally displaced. Tachy regular. No rubs, gallops. 2/6 MR murmur Lungs: clear Abdomen: soft, nontender, nondistended. No hepatosplenomegaly. No bruits or masses. Good bowel sounds. Extremities: no cyanosis, clubbing, rash, + compression hose tr edema. Multiple gout nodules. Neuro: alert & orientedx3, cranial nerves grossly intact. Moves all 4 extremities w/o difficulty. Affect pleasant.  ECG: Atrial flutter with v-rate 132.    ASSESSMENT & PLAN:

## 2011-03-16 ENCOUNTER — Encounter: Payer: Self-pay | Admitting: Internal Medicine

## 2011-03-16 DIAGNOSIS — I4892 Unspecified atrial flutter: Secondary | ICD-10-CM | POA: Insufficient documentation

## 2011-03-16 DIAGNOSIS — M109 Gout, unspecified: Secondary | ICD-10-CM | POA: Insufficient documentation

## 2011-03-16 NOTE — Assessment & Plan Note (Signed)
Reviewed with Dr. Ladona Ridgel. As above with plan TEE/DC-CV next week and then he will see Dr. Ladona Ridgel for repeat ablation and consideration of possible anti-arrhythmic therapy.

## 2011-03-16 NOTE — Assessment & Plan Note (Signed)
He has NYHA II-III symptoms with volume overload in setting of recurrent atrial flutter. Will increase lasix back to bid. Will start coumadin and arrange for TEE/DC-CV once INR therapeutic next week.

## 2011-03-16 NOTE — Assessment & Plan Note (Signed)
Will refer to Dr. Dareen Piano for evaluation and treatment of severe gout.

## 2011-03-17 ENCOUNTER — Ambulatory Visit (INDEPENDENT_AMBULATORY_CARE_PROVIDER_SITE_OTHER): Payer: Self-pay | Admitting: *Deleted

## 2011-03-17 ENCOUNTER — Other Ambulatory Visit (INDEPENDENT_AMBULATORY_CARE_PROVIDER_SITE_OTHER): Payer: Self-pay | Admitting: *Deleted

## 2011-03-17 DIAGNOSIS — I4892 Unspecified atrial flutter: Secondary | ICD-10-CM

## 2011-03-17 DIAGNOSIS — Z7901 Long term (current) use of anticoagulants: Secondary | ICD-10-CM

## 2011-03-17 LAB — BASIC METABOLIC PANEL
BUN: 22 mg/dL (ref 6–23)
CO2: 30 mEq/L (ref 19–32)
Chloride: 99 mEq/L (ref 96–112)
Potassium: 3.5 mEq/L (ref 3.5–5.1)

## 2011-03-20 ENCOUNTER — Ambulatory Visit (INDEPENDENT_AMBULATORY_CARE_PROVIDER_SITE_OTHER): Payer: Self-pay | Admitting: *Deleted

## 2011-03-20 DIAGNOSIS — Z7901 Long term (current) use of anticoagulants: Secondary | ICD-10-CM

## 2011-03-20 DIAGNOSIS — I4892 Unspecified atrial flutter: Secondary | ICD-10-CM

## 2011-03-20 LAB — POCT INR: INR: 2

## 2011-03-21 ENCOUNTER — Ambulatory Visit (HOSPITAL_COMMUNITY)
Admission: RE | Admit: 2011-03-21 | Discharge: 2011-03-21 | Disposition: A | Discharge status: Home / Self Care | Payer: Self-pay | Source: Ambulatory Visit | Attending: Internal Medicine | Admitting: Internal Medicine

## 2011-03-21 DIAGNOSIS — I4892 Unspecified atrial flutter: Secondary | ICD-10-CM

## 2011-03-21 DIAGNOSIS — I10 Essential (primary) hypertension: Secondary | ICD-10-CM | POA: Insufficient documentation

## 2011-03-21 DIAGNOSIS — K219 Gastro-esophageal reflux disease without esophagitis: Secondary | ICD-10-CM | POA: Insufficient documentation

## 2011-03-21 DIAGNOSIS — I059 Rheumatic mitral valve disease, unspecified: Secondary | ICD-10-CM | POA: Insufficient documentation

## 2011-03-24 NOTE — Op Note (Signed)
  NAMEEMMERSON, TADDEI              ACCOUNT NO.:  [id number]  MEDICAL RECORD NO.:  [id number]  LOCATION:  [facility]                         FACILITY:  [facility]  PHYSICIAN:  Madolyn Frieze. Jens Som, MD, FACCDATE OF BIRTH:  October 16, 1960  DATE OF PROCEDURE:  03/21/2011 DATE OF DISCHARGE:                              OPERATIVE REPORT   PROCEDURE:  Cardioversion of atrial flutter.  OPERATOR:  Madolyn Frieze. Jens Som, MD, [facility]  DESCRIPTION OF PROCEDURE:  A transesophageal echocardiogram prior to the procedure showed no left atrial appendage thrombus.  The patient was sedated by anesthesia with Etomidate 20 mg intravenously.  Synchronized cardioversion with 120 joules resulted in sinus rhythm.  There were no immediate complications.  We would recommend continuing Coumadin.     Madolyn Frieze Jens Som, MD, [facility]     BSC/MEDQ  D:  03/21/2011  T:  03/21/2011  Job:  [id number]  Electronically Signed by Olga Millers MD [facility] on 03/24/2011 08:41:08 AM

## 2011-03-27 ENCOUNTER — Ambulatory Visit (INDEPENDENT_AMBULATORY_CARE_PROVIDER_SITE_OTHER): Payer: Self-pay | Admitting: *Deleted

## 2011-03-27 DIAGNOSIS — Z7901 Long term (current) use of anticoagulants: Secondary | ICD-10-CM

## 2011-03-27 DIAGNOSIS — I4892 Unspecified atrial flutter: Secondary | ICD-10-CM

## 2011-03-27 LAB — POCT INR: INR: 3.9

## 2011-04-04 ENCOUNTER — Ambulatory Visit (INDEPENDENT_AMBULATORY_CARE_PROVIDER_SITE_OTHER): Payer: Self-pay | Admitting: Internal Medicine

## 2011-04-04 ENCOUNTER — Ambulatory Visit (INDEPENDENT_AMBULATORY_CARE_PROVIDER_SITE_OTHER): Payer: Self-pay | Admitting: *Deleted

## 2011-04-04 ENCOUNTER — Ambulatory Visit: Payer: Self-pay | Admitting: Internal Medicine

## 2011-04-04 ENCOUNTER — Encounter: Payer: Self-pay | Admitting: Internal Medicine

## 2011-04-04 DIAGNOSIS — I5022 Chronic systolic (congestive) heart failure: Secondary | ICD-10-CM

## 2011-04-04 DIAGNOSIS — I4892 Unspecified atrial flutter: Secondary | ICD-10-CM

## 2011-04-04 DIAGNOSIS — I10 Essential (primary) hypertension: Secondary | ICD-10-CM

## 2011-04-04 DIAGNOSIS — Z7901 Long term (current) use of anticoagulants: Secondary | ICD-10-CM

## 2011-04-04 LAB — POCT INR: INR: 2.8

## 2011-04-04 NOTE — Assessment & Plan Note (Signed)
His blood pressure is well controled. Continue a low sodium diet.

## 2011-04-04 NOTE — Patient Instructions (Signed)
Your physician recommends that you schedule a follow-up appointment in: as needed  

## 2011-04-04 NOTE — Progress Notes (Signed)
HPI [name] returns today for followup. He is a pleasant middle aged man with a h/o DCM, chronic systolic CHF, and multiple atrial arrhythmias. He underwent catheter ablatio several months ago and had inducible AVNRT, atrial tachy times two. He saw Dr. Teressa Lower several weeks ago and was found to have atrial flutter. His ECG is not in our system currently. He has reverted back to NSR. He denies c/p, sob, or palpitation. No other complaints today. No Known Allergies   Current Outpatient Prescriptions  Medication Sig Dispense Refill  . carvedilol (COREG) 12.5 MG tablet Take 1 tablet (12.5 mg total) by mouth 2 (two) times daily with a meal.  60 tablet  6  . cetirizine (ZYRTEC) 10 MG tablet Take 10 mg by mouth daily.        Marland Kitchen esomeprazole (NEXIUM) 40 MG capsule Take 40 mg by mouth as needed.       . furosemide (LASIX) 40 MG tablet Take 1 tablet (40 mg total) by mouth 2 (two) times daily.  60 tablet  6  . lisinopril (PRINIVIL,ZESTRIL) 10 MG tablet Take 10 mg by mouth daily. 1 tablet twice a day       . warfarin (COUMADIN) 5 MG tablet Take 1.5 tablets (7.5 mg total) by mouth daily. As direceted  45 tablet  3     Past Medical History  Diagnosis Date  . CHF (congestive heart failure)   . Edema   . HTN (hypertension)     ROS:   All systems reviewed and negative except as noted in the HPI.   Past Surgical History  Procedure Date  . Synovectomy      No family history on file.   History   Social History  . Marital Status: Single    Spouse Name: N/A    Number of Children: N/A  . Years of Education: N/A   Occupational History  . Not on file.   Social History Main Topics  . Smoking status: Never Smoker   . Smokeless tobacco: Never Used  . Alcohol Use: Yes     occasionally  . Drug Use: No  . Sexually Active: Not on file   Other Topics Concern  . Not on file   Social History Narrative  . No narrative on file     BP 115/77  Ht 6\' 2"  (1.88 m)  Wt 212 lb (96.163 kg)   BMI 27.22 kg/m2  Physical Exam:  Well appearing NAD HEENT: Unremarkable Neck:  No JVD, no thyromegally Lymphatics:  No adenopathy Back:  No CVA tenderness Lungs:  Clear with no wheezes or rhonchi HEART:  Regular rate rhythm, no murmurs, no rubs, no clicks Abd:  soft, positive bowel sounds, no organomegally, no rebound, no guarding Ext:  2 plus pulses, no edema, no cyanosis, no clubbing Skin:  No rashes no nodules Neuro:  CN II through XII intact, motor grossly intact  EKG NSR  Assess/Plan:

## 2011-04-04 NOTE — Assessment & Plan Note (Signed)
He has reverted back to NSR. I have recommended a period of watchful waiting as he has not been symptomatic.

## 2011-04-04 NOTE — Assessment & Plan Note (Signed)
His symptoms are well controlled. He will continue his medical therapy.

## 2011-04-18 ENCOUNTER — Ambulatory Visit (INDEPENDENT_AMBULATORY_CARE_PROVIDER_SITE_OTHER): Payer: Self-pay | Admitting: *Deleted

## 2011-04-18 DIAGNOSIS — Z7901 Long term (current) use of anticoagulants: Secondary | ICD-10-CM

## 2011-04-18 DIAGNOSIS — I4892 Unspecified atrial flutter: Secondary | ICD-10-CM

## 2011-04-19 ENCOUNTER — Ambulatory Visit: Payer: Self-pay | Admitting: Internal Medicine

## 2011-05-02 ENCOUNTER — Encounter (HOSPITAL_COMMUNITY): Payer: Self-pay | Admitting: *Deleted

## 2011-05-02 ENCOUNTER — Encounter: Payer: Self-pay | Admitting: Internal Medicine

## 2011-05-16 ENCOUNTER — Ambulatory Visit (INDEPENDENT_AMBULATORY_CARE_PROVIDER_SITE_OTHER): Payer: PRIVATE HEALTH INSURANCE | Admitting: *Deleted

## 2011-05-16 DIAGNOSIS — I4892 Unspecified atrial flutter: Secondary | ICD-10-CM

## 2011-05-16 DIAGNOSIS — Z7901 Long term (current) use of anticoagulants: Secondary | ICD-10-CM

## 2011-06-06 ENCOUNTER — Ambulatory Visit (INDEPENDENT_AMBULATORY_CARE_PROVIDER_SITE_OTHER): Payer: PRIVATE HEALTH INSURANCE | Admitting: *Deleted

## 2011-06-06 DIAGNOSIS — I4892 Unspecified atrial flutter: Secondary | ICD-10-CM

## 2011-06-06 DIAGNOSIS — Z7901 Long term (current) use of anticoagulants: Secondary | ICD-10-CM

## 2011-06-27 ENCOUNTER — Ambulatory Visit (INDEPENDENT_AMBULATORY_CARE_PROVIDER_SITE_OTHER): Payer: PRIVATE HEALTH INSURANCE | Admitting: *Deleted

## 2011-06-27 DIAGNOSIS — Z7901 Long term (current) use of anticoagulants: Secondary | ICD-10-CM

## 2011-06-27 DIAGNOSIS — I4892 Unspecified atrial flutter: Secondary | ICD-10-CM

## 2011-06-27 MED ORDER — WARFARIN SODIUM 5 MG PO TABS: 7.5000 mg | ORAL_TABLET | Freq: Every day | ORAL | Status: DC

## 2011-07-18 ENCOUNTER — Ambulatory Visit (INDEPENDENT_AMBULATORY_CARE_PROVIDER_SITE_OTHER): Payer: PRIVATE HEALTH INSURANCE | Admitting: *Deleted

## 2011-07-18 DIAGNOSIS — I4892 Unspecified atrial flutter: Secondary | ICD-10-CM

## 2011-07-18 DIAGNOSIS — Z7901 Long term (current) use of anticoagulants: Secondary | ICD-10-CM

## 2011-08-17 ENCOUNTER — Ambulatory Visit (INDEPENDENT_AMBULATORY_CARE_PROVIDER_SITE_OTHER): Payer: PRIVATE HEALTH INSURANCE | Admitting: *Deleted

## 2011-08-17 DIAGNOSIS — Z7901 Long term (current) use of anticoagulants: Secondary | ICD-10-CM

## 2011-08-17 DIAGNOSIS — I4892 Unspecified atrial flutter: Secondary | ICD-10-CM

## 2011-09-14 ENCOUNTER — Ambulatory Visit (INDEPENDENT_AMBULATORY_CARE_PROVIDER_SITE_OTHER): Payer: PRIVATE HEALTH INSURANCE

## 2011-09-14 DIAGNOSIS — I4892 Unspecified atrial flutter: Secondary | ICD-10-CM

## 2011-09-14 DIAGNOSIS — Z7901 Long term (current) use of anticoagulants: Secondary | ICD-10-CM

## 2011-09-14 LAB — POCT INR: INR: 1.3

## 2011-09-28 ENCOUNTER — Ambulatory Visit (INDEPENDENT_AMBULATORY_CARE_PROVIDER_SITE_OTHER): Payer: PRIVATE HEALTH INSURANCE | Admitting: *Deleted

## 2011-09-28 DIAGNOSIS — I4892 Unspecified atrial flutter: Secondary | ICD-10-CM

## 2011-09-28 DIAGNOSIS — Z7901 Long term (current) use of anticoagulants: Secondary | ICD-10-CM

## 2011-09-28 LAB — POCT INR: INR: 2

## 2011-10-24 ENCOUNTER — Ambulatory Visit (INDEPENDENT_AMBULATORY_CARE_PROVIDER_SITE_OTHER): Payer: PRIVATE HEALTH INSURANCE | Admitting: Pharmacist

## 2011-10-24 DIAGNOSIS — Z7901 Long term (current) use of anticoagulants: Secondary | ICD-10-CM

## 2011-10-24 DIAGNOSIS — I4892 Unspecified atrial flutter: Secondary | ICD-10-CM

## 2011-10-24 MED ORDER — WARFARIN SODIUM 5 MG PO TABS: ORAL_TABLET | ORAL | Status: DC

## 2011-11-09 ENCOUNTER — Other Ambulatory Visit: Payer: Self-pay | Admitting: *Deleted

## 2011-11-09 ENCOUNTER — Ambulatory Visit (INDEPENDENT_AMBULATORY_CARE_PROVIDER_SITE_OTHER): Payer: PRIVATE HEALTH INSURANCE | Admitting: Family Medicine

## 2011-11-09 ENCOUNTER — Other Ambulatory Visit: Payer: Self-pay | Admitting: Cardiovascular Disease

## 2011-11-09 VITALS — BP 93/64 | HR 82 | Temp 98.5°F | Resp 16 | Ht 73.0 in | Wt 233.0 lb

## 2011-11-09 DIAGNOSIS — M7660 Achilles tendinitis, unspecified leg: Secondary | ICD-10-CM

## 2011-11-09 DIAGNOSIS — Z1211 Encounter for screening for malignant neoplasm of colon: Secondary | ICD-10-CM

## 2011-11-09 DIAGNOSIS — Z0000 Encounter for general adult medical examination without abnormal findings: Secondary | ICD-10-CM

## 2011-11-09 DIAGNOSIS — E119 Type 2 diabetes mellitus without complications: Secondary | ICD-10-CM

## 2011-11-09 DIAGNOSIS — M069 Rheumatoid arthritis, unspecified: Secondary | ICD-10-CM

## 2011-11-09 LAB — CBC
MCH: 32.7 pg (ref 26.0–34.0)
MCHC: 35.9 g/dL (ref 30.0–36.0)
MCV: 91.1 fL (ref 78.0–100.0)
Platelets: 327 10*3/uL (ref 150–400)
RDW: 13.2 % (ref 11.5–15.5)
WBC: 10.9 10*3/uL — ABNORMAL HIGH (ref 4.0–10.5)

## 2011-11-09 LAB — POCT GLYCOSYLATED HEMOGLOBIN (HGB A1C)

## 2011-11-09 LAB — COMPREHENSIVE METABOLIC PANEL
ALT: 22 U/L (ref 0–53)
BUN: 16 mg/dL (ref 6–23)
CO2: 33 mEq/L — ABNORMAL HIGH (ref 19–32)
Creat: 0.87 mg/dL (ref 0.50–1.35)
Total Bilirubin: 0.9 mg/dL (ref 0.3–1.2)

## 2011-11-09 LAB — LIPID PANEL
Cholesterol: 239 mg/dL — ABNORMAL HIGH (ref 0–200)
HDL: 59 mg/dL (ref >39)
Total CHOL/HDL Ratio: 4.1 Ratio
VLDL: 37 mg/dL (ref 0–40)

## 2011-11-09 LAB — GLUCOSE, POCT (MANUAL RESULT ENTRY): POC Glucose: 134

## 2011-11-09 MED ORDER — PREDNISONE 20 MG PO TABS: ORAL_TABLET | ORAL | Status: AC

## 2011-11-09 MED ORDER — METFORMIN HCL 500 MG PO TABS: 500.0000 mg | ORAL_TABLET | Freq: Two times a day (BID) | ORAL | Status: DC

## 2011-11-09 MED ORDER — COLCHICINE 0.6 MG PO TABS: 0.6000 mg | ORAL_TABLET | Freq: Every day | ORAL | Status: DC

## 2011-11-09 NOTE — Progress Notes (Signed)
  Subjective:    Patient ID: John Pham, male    DOB: Jul 30, 1961, 51 y.o.   MRN: [id number]  HPI 51 yo male with a flutter, cardiomegaly, and gout here for cpe.  Due for colonoscopy.  Complains of left foot swelling/pain now.  Not always that joint, though.  Has had feet, ankles, knees, elbows.  Will wake up with swelling and throbbing.  Only thing that ever helps is prednisone.  Will occur maybe every other month.   Prednisone seems to help a lot.  Diagnosed with rheumatoid arthritis in 2009 (positive RF).  Was sent to a specialist (can't remember name) and he would prescribe periodic prednisone.  Did not have diabetes/glucose intolerance at that time.  Does take metformin.   Willing to go back to rheumatologist. Otherwise he is doing well.   Had cardioversion for a flutter in May and has been doing very well since then.  Feeling much better.   Fasting this morning.   Review of Systems Negative except as per HPI     Objective:   Physical Exam  Constitutional: Vital signs are normal. He appears well-developed and well-nourished. He is active.  HENT:  Head: Normocephalic.  Right Ear: Hearing, tympanic membrane, external ear and ear canal normal.  Left Ear: Hearing, tympanic membrane, external ear and ear canal normal.  Nose: Nose normal.  Mouth/Throat: Uvula is midline, oropharynx is clear and moist and mucous membranes are normal.  Eyes: Conjunctivae and EOM are normal. Pupils are equal, round, and reactive to light.  Neck: No mass and no thyromegaly present.  Cardiovascular: Normal rate, regular rhythm, normal heart sounds, intact distal pulses and normal pulses.   Pulmonary/Chest: Effort normal and breath sounds normal.  Abdominal: Soft. Normal appearance and bowel sounds are normal. There is no hepatosplenomegaly. There is no tenderness. There is no CVA tenderness. No hernia.  Genitourinary: Testes normal and penis normal.  Lymphadenopathy:    He has no cervical adenopathy.    He has no axillary adenopathy.  Neurological: He is alert.   Left achilles swollen, boggy, TTP  Results for orders placed in visit on 11/09/11  GLUCOSE, POCT (MANUAL RESULT ENTRY)      Component Value Range   POC Glucose 134    POCT GLYCOSYLATED HEMOGLOBIN (HGB A1C)      Component Value Range   Hemoglobin A1C 6.7`           Assessment & Plan:  CPE - labs pending.  Schedule colonoscopy DM - good control.  Continue Meformin 500 BID Achilles tendinopathy - predtaper (sugar good today), rest Gout - refilled colchicine RA - refer to rheum for alternative to prednisone for treatment given he is now diabetic.

## 2011-11-11 ENCOUNTER — Other Ambulatory Visit: Payer: Self-pay | Admitting: Family Medicine

## 2011-11-11 MED ORDER — ATORVASTATIN CALCIUM 40 MG PO TABS: 40.0000 mg | ORAL_TABLET | Freq: Every day | ORAL | Status: DC

## 2011-11-14 ENCOUNTER — Ambulatory Visit (INDEPENDENT_AMBULATORY_CARE_PROVIDER_SITE_OTHER): Payer: PRIVATE HEALTH INSURANCE

## 2011-11-14 DIAGNOSIS — I4892 Unspecified atrial flutter: Secondary | ICD-10-CM

## 2011-11-14 DIAGNOSIS — Z7901 Long term (current) use of anticoagulants: Secondary | ICD-10-CM

## 2011-11-27 ENCOUNTER — Telehealth: Payer: Self-pay

## 2011-11-27 DIAGNOSIS — M069 Rheumatoid arthritis, unspecified: Secondary | ICD-10-CM

## 2011-11-27 DIAGNOSIS — M0520 Rheumatoid vasculitis with rheumatoid arthritis of unspecified site: Secondary | ICD-10-CM

## 2011-11-27 NOTE — Telephone Encounter (Signed)
Pt was referred and that practice was no in network with insurance would like to be referred to dr Lurena Nida if possible

## 2011-11-28 NOTE — Telephone Encounter (Signed)
Pt would like for Dr Georgiana Shore to referr him to Dr Lurena Nida pt state that [facility] would like him to see a rheamatologist and he would like to be referred to this Dr because the Dr will except his insurance Dr Paulino Rily office # [phone] and fax # [phone]

## 2011-11-29 NOTE — Telephone Encounter (Signed)
Referral done.  John Pham

## 2011-12-07 ENCOUNTER — Ambulatory Visit (INDEPENDENT_AMBULATORY_CARE_PROVIDER_SITE_OTHER): Payer: PRIVATE HEALTH INSURANCE | Admitting: Pharmacist

## 2011-12-07 DIAGNOSIS — Z7901 Long term (current) use of anticoagulants: Secondary | ICD-10-CM

## 2011-12-07 DIAGNOSIS — I4892 Unspecified atrial flutter: Secondary | ICD-10-CM

## 2011-12-07 LAB — POCT INR: INR: 2.4

## 2011-12-13 ENCOUNTER — Other Ambulatory Visit: Payer: Self-pay | Admitting: Family Medicine

## 2012-01-11 ENCOUNTER — Telehealth: Payer: Self-pay

## 2012-01-11 NOTE — Telephone Encounter (Signed)
Pt left a message on referrals desk about giving dr Dareen Piano records and what to send but we have not referred him and he acted as though he was returning a call   Please call [phone]

## 2012-01-13 ENCOUNTER — Other Ambulatory Visit: Payer: Self-pay | Admitting: Internal Medicine

## 2012-01-13 ENCOUNTER — Other Ambulatory Visit: Payer: Self-pay | Admitting: *Deleted

## 2012-01-13 ENCOUNTER — Other Ambulatory Visit: Payer: Self-pay | Admitting: Cardiovascular Disease

## 2012-01-13 MED ORDER — CARVEDILOL 12.5 MG PO TABS: 12.5000 mg | ORAL_TABLET | Freq: Two times a day (BID) | ORAL | Status: DC

## 2012-01-13 NOTE — Telephone Encounter (Signed)
SEE NOTES IN CHART REVIEW HE WAS REFERRED TO [facility]

## 2012-01-15 ENCOUNTER — Other Ambulatory Visit: Payer: Self-pay | Admitting: *Deleted

## 2012-01-15 MED ORDER — WARFARIN SODIUM 5 MG PO TABS: ORAL_TABLET | ORAL | Status: DC

## 2012-01-18 ENCOUNTER — Ambulatory Visit (INDEPENDENT_AMBULATORY_CARE_PROVIDER_SITE_OTHER): Payer: PRIVATE HEALTH INSURANCE | Admitting: Pharmacist

## 2012-01-18 DIAGNOSIS — Z7901 Long term (current) use of anticoagulants: Secondary | ICD-10-CM

## 2012-01-18 DIAGNOSIS — I4892 Unspecified atrial flutter: Secondary | ICD-10-CM

## 2012-02-01 ENCOUNTER — Ambulatory Visit (INDEPENDENT_AMBULATORY_CARE_PROVIDER_SITE_OTHER): Payer: PRIVATE HEALTH INSURANCE | Admitting: *Deleted

## 2012-02-01 DIAGNOSIS — Z7901 Long term (current) use of anticoagulants: Secondary | ICD-10-CM

## 2012-02-01 DIAGNOSIS — I4892 Unspecified atrial flutter: Secondary | ICD-10-CM

## 2012-02-07 ENCOUNTER — Other Ambulatory Visit: Payer: Self-pay | Admitting: Internal Medicine

## 2012-02-13 ENCOUNTER — Ambulatory Visit (INDEPENDENT_AMBULATORY_CARE_PROVIDER_SITE_OTHER): Payer: Self-pay | Admitting: *Deleted

## 2012-02-13 DIAGNOSIS — I4892 Unspecified atrial flutter: Secondary | ICD-10-CM

## 2012-02-13 DIAGNOSIS — Z7901 Long term (current) use of anticoagulants: Secondary | ICD-10-CM

## 2012-02-13 LAB — POCT INR: INR: 1.9

## 2012-02-13 NOTE — Patient Instructions (Addendum)
Take an extra 1/2 tablet today and then  Continue taking 2 tablets everyday except 1.5 tablets on Mondays, Wednesday, and Fridays. 02/17/2012 will be last dose of coumadin. When you start back your coumadin per MD instructions take an extra 1/2 tablet for 2 days.

## 2012-02-14 ENCOUNTER — Telehealth: Payer: Self-pay | Admitting: *Deleted

## 2012-02-14 NOTE — Telephone Encounter (Signed)
Informed John Pham at Dr Trilby Drummer off of Dr Melburn Popper note regarding lovenox briding for this pt, for colonscopy 02/27/2012.

## 2012-02-14 NOTE — Telephone Encounter (Signed)
Message copied by Carmela Hurt on Wed Feb 14, 2012 11:54 AM ------      Message from: Dolores Patty      Created: Tue Feb 13, 2012  4:36 PM      Regarding: RE: colonoscopy       He has not followed up with me since July 2012. He is on coumadin for AFL so should be ok to hold but I can't give definitive recommendations as he has been noncompliant with f/u in our office.            Marton Seta - can you see if we can get him to start following up again?  Thanks             ----- Message -----         From: Carmela Hurt, RN         Sent: 02/13/2012   3:38 PM           To: Dolores Patty, MD, Noralee Space, RN, #      Subject: colonoscopy                                              This patient for colonoscopy 02/23/2012, he has been instructed to hold coumadin for 5 days prior, just wanted to make sure this patient does not need lovenox bridging.            Thanks, Addison Lank, RN

## 2012-03-05 ENCOUNTER — Ambulatory Visit (INDEPENDENT_AMBULATORY_CARE_PROVIDER_SITE_OTHER): Payer: Self-pay | Admitting: *Deleted

## 2012-03-05 DIAGNOSIS — I4892 Unspecified atrial flutter: Secondary | ICD-10-CM

## 2012-03-05 DIAGNOSIS — Z7901 Long term (current) use of anticoagulants: Secondary | ICD-10-CM

## 2012-03-05 LAB — POCT INR: INR: 1.5

## 2012-03-19 ENCOUNTER — Ambulatory Visit (INDEPENDENT_AMBULATORY_CARE_PROVIDER_SITE_OTHER): Payer: Self-pay | Admitting: *Deleted

## 2012-03-19 DIAGNOSIS — I4892 Unspecified atrial flutter: Secondary | ICD-10-CM

## 2012-03-19 DIAGNOSIS — Z7901 Long term (current) use of anticoagulants: Secondary | ICD-10-CM

## 2012-03-19 LAB — POCT INR: INR: 1.7

## 2012-03-19 MED ORDER — WARFARIN SODIUM 5 MG PO TABS: ORAL_TABLET | ORAL | Status: DC

## 2012-04-02 ENCOUNTER — Ambulatory Visit (INDEPENDENT_AMBULATORY_CARE_PROVIDER_SITE_OTHER): Payer: Self-pay | Admitting: *Deleted

## 2012-04-02 DIAGNOSIS — I4892 Unspecified atrial flutter: Secondary | ICD-10-CM

## 2012-04-02 DIAGNOSIS — Z7901 Long term (current) use of anticoagulants: Secondary | ICD-10-CM

## 2012-04-02 LAB — POCT INR: INR: 1.5

## 2012-04-12 ENCOUNTER — Ambulatory Visit (INDEPENDENT_AMBULATORY_CARE_PROVIDER_SITE_OTHER): Payer: Self-pay

## 2012-04-12 DIAGNOSIS — Z7901 Long term (current) use of anticoagulants: Secondary | ICD-10-CM

## 2012-04-12 DIAGNOSIS — I4892 Unspecified atrial flutter: Secondary | ICD-10-CM

## 2012-04-12 LAB — POCT INR: INR: 1.9

## 2012-04-30 ENCOUNTER — Ambulatory Visit (INDEPENDENT_AMBULATORY_CARE_PROVIDER_SITE_OTHER): Payer: Self-pay | Admitting: Pharmacist

## 2012-04-30 DIAGNOSIS — Z7901 Long term (current) use of anticoagulants: Secondary | ICD-10-CM

## 2012-04-30 DIAGNOSIS — I4892 Unspecified atrial flutter: Secondary | ICD-10-CM

## 2012-04-30 LAB — POCT INR: INR: 3.4

## 2012-05-01 ENCOUNTER — Other Ambulatory Visit: Payer: Self-pay | Admitting: Internal Medicine

## 2012-05-07 ENCOUNTER — Other Ambulatory Visit: Payer: Self-pay | Admitting: Internal Medicine

## 2012-05-14 ENCOUNTER — Ambulatory Visit (INDEPENDENT_AMBULATORY_CARE_PROVIDER_SITE_OTHER): Payer: Self-pay | Admitting: *Deleted

## 2012-05-14 DIAGNOSIS — I4892 Unspecified atrial flutter: Secondary | ICD-10-CM

## 2012-05-14 DIAGNOSIS — Z7901 Long term (current) use of anticoagulants: Secondary | ICD-10-CM

## 2012-05-16 ENCOUNTER — Other Ambulatory Visit (HOSPITAL_COMMUNITY): Payer: Self-pay | Admitting: *Deleted

## 2012-05-16 MED ORDER — FUROSEMIDE 40 MG PO TABS: 40.0000 mg | ORAL_TABLET | Freq: Two times a day (BID) | ORAL | Status: DC

## 2012-05-31 ENCOUNTER — Other Ambulatory Visit: Payer: Self-pay | Admitting: Physician Assistant

## 2012-05-31 NOTE — Telephone Encounter (Signed)
Chart pulled to PA pool at nurses station [phone]

## 2012-06-13 ENCOUNTER — Ambulatory Visit (INDEPENDENT_AMBULATORY_CARE_PROVIDER_SITE_OTHER): Payer: Self-pay | Admitting: *Deleted

## 2012-06-13 DIAGNOSIS — Z7901 Long term (current) use of anticoagulants: Secondary | ICD-10-CM

## 2012-06-13 DIAGNOSIS — I4892 Unspecified atrial flutter: Secondary | ICD-10-CM

## 2012-06-13 LAB — POCT INR: INR: 2.4

## 2012-06-13 MED ORDER — WARFARIN SODIUM 5 MG PO TABS: 5.0000 mg | ORAL_TABLET | ORAL | Status: DC

## 2012-06-14 ENCOUNTER — Other Ambulatory Visit: Payer: Self-pay | Admitting: Family Medicine

## 2012-07-08 ENCOUNTER — Other Ambulatory Visit: Payer: Self-pay | Admitting: Internal Medicine

## 2012-07-13 ENCOUNTER — Ambulatory Visit (INDEPENDENT_AMBULATORY_CARE_PROVIDER_SITE_OTHER): Payer: PRIVATE HEALTH INSURANCE | Admitting: Family Medicine

## 2012-07-13 VITALS — BP 148/101 | HR 83 | Temp 98.3°F | Resp 16 | Ht 72.5 in | Wt 244.0 lb

## 2012-07-13 DIAGNOSIS — E119 Type 2 diabetes mellitus without complications: Secondary | ICD-10-CM | POA: Insufficient documentation

## 2012-07-13 DIAGNOSIS — I10 Essential (primary) hypertension: Secondary | ICD-10-CM

## 2012-07-13 DIAGNOSIS — M109 Gout, unspecified: Secondary | ICD-10-CM

## 2012-07-13 DIAGNOSIS — E785 Hyperlipidemia, unspecified: Secondary | ICD-10-CM

## 2012-07-13 LAB — MICROALBUMIN, URINE: Microalb, Ur: 0.5 mg/dL (ref 0.00–1.89)

## 2012-07-13 LAB — POCT GLYCOSYLATED HEMOGLOBIN (HGB A1C): Hemoglobin A1C: 6.8

## 2012-07-13 LAB — COMPREHENSIVE METABOLIC PANEL
ALT: 24 U/L (ref 0–53)
AST: 20 U/L (ref 0–37)
Albumin: 4.5 g/dL (ref 3.5–5.2)
Alkaline Phosphatase: 28 U/L — ABNORMAL LOW (ref 39–117)
BUN: 20 mg/dL (ref 6–23)
Creat: 0.94 mg/dL (ref 0.50–1.35)
Potassium: 4.4 mEq/L (ref 3.5–5.3)

## 2012-07-13 LAB — LIPID PANEL
HDL: 65 mg/dL (ref >39)
LDL Cholesterol: 62 mg/dL (ref 0–99)
Total CHOL/HDL Ratio: 2.4 Ratio

## 2012-07-13 LAB — URIC ACID: Uric Acid, Serum: 3.5 mg/dL — ABNORMAL LOW (ref 4.0–7.8)

## 2012-07-13 MED ORDER — LISINOPRIL 20 MG PO TABS: 20.0000 mg | ORAL_TABLET | Freq: Every day | ORAL | Status: DC

## 2012-07-13 MED ORDER — ATORVASTATIN CALCIUM 40 MG PO TABS: 40.0000 mg | ORAL_TABLET | Freq: Every day | ORAL | Status: DC

## 2012-07-13 MED ORDER — METFORMIN HCL 500 MG PO TABS: 500.0000 mg | ORAL_TABLET | Freq: Two times a day (BID) | ORAL | Status: DC

## 2012-07-13 NOTE — Progress Notes (Signed)
Subjective: Patient is here for regular visits to get some of his medicines refilled. He's been doing well. He has no major complaints. Since I last saw him he had congestive heart failure problems with an irregular heart, which sounds like he fibrillation. He developed marked edema. He was treated with diuretics, but eventually did well when he was placed on carvedilol. They attempted an ablation on him, but I believe that was unsuccessful. He continues on chronic Coumadin therapy. He takes his medicines regularly. He exercises. He is trying to eat right. He feels good. He is working.  Objective: Pleasant male in no acute distress. Chest clear. Heart regular without murmurs. And soft without masses or tenderness. No ankle edema.  Assessment: Diabetes Hypertension Hyperlipidemia History of congestive heart failure History fibrillation  Plan: Check labs. Return in 6 months. Gave him when it is worth of refills. Stressed importance of not coming off his medicines.      Results for orders placed in visit on 07/13/12  POCT GLYCOSYLATED HEMOGLOBIN (HGB A1C)      Component Value Range   Hemoglobin A1C 6.8

## 2012-07-13 NOTE — Patient Instructions (Signed)
Continue current medications.  Continue to try and get regular exercise  If problems occur return sooner, otherwise tried to come back in the early summer, about early June.

## 2012-07-14 ENCOUNTER — Encounter: Payer: Self-pay | Admitting: Family Medicine

## 2012-07-16 ENCOUNTER — Ambulatory Visit (INDEPENDENT_AMBULATORY_CARE_PROVIDER_SITE_OTHER): Payer: PRIVATE HEALTH INSURANCE | Admitting: *Deleted

## 2012-07-16 DIAGNOSIS — Z7901 Long term (current) use of anticoagulants: Secondary | ICD-10-CM

## 2012-07-16 DIAGNOSIS — I4892 Unspecified atrial flutter: Secondary | ICD-10-CM

## 2012-08-04 ENCOUNTER — Other Ambulatory Visit: Payer: Self-pay | Admitting: Internal Medicine

## 2012-08-20 ENCOUNTER — Ambulatory Visit (INDEPENDENT_AMBULATORY_CARE_PROVIDER_SITE_OTHER): Payer: Self-pay

## 2012-08-20 DIAGNOSIS — Z7901 Long term (current) use of anticoagulants: Secondary | ICD-10-CM

## 2012-08-20 DIAGNOSIS — I4892 Unspecified atrial flutter: Secondary | ICD-10-CM

## 2012-08-22 ENCOUNTER — Other Ambulatory Visit: Payer: Self-pay | Admitting: Physician Assistant

## 2012-10-01 ENCOUNTER — Ambulatory Visit (INDEPENDENT_AMBULATORY_CARE_PROVIDER_SITE_OTHER): Payer: Self-pay | Admitting: Pharmacist

## 2012-10-01 DIAGNOSIS — Z7901 Long term (current) use of anticoagulants: Secondary | ICD-10-CM

## 2012-10-01 DIAGNOSIS — I4892 Unspecified atrial flutter: Secondary | ICD-10-CM

## 2012-10-23 ENCOUNTER — Other Ambulatory Visit: Payer: Self-pay | Admitting: Physician Assistant

## 2012-11-08 ENCOUNTER — Other Ambulatory Visit: Payer: Self-pay | Admitting: Physician Assistant

## 2012-11-12 ENCOUNTER — Ambulatory Visit (INDEPENDENT_AMBULATORY_CARE_PROVIDER_SITE_OTHER): Payer: Self-pay | Admitting: *Deleted

## 2012-11-12 DIAGNOSIS — Z7901 Long term (current) use of anticoagulants: Secondary | ICD-10-CM

## 2012-11-12 DIAGNOSIS — I4892 Unspecified atrial flutter: Secondary | ICD-10-CM

## 2012-11-12 LAB — POCT INR: INR: 2.9

## 2012-12-18 ENCOUNTER — Other Ambulatory Visit: Payer: Self-pay | Admitting: *Deleted

## 2012-12-18 ENCOUNTER — Other Ambulatory Visit: Payer: Self-pay | Admitting: Internal Medicine

## 2012-12-24 ENCOUNTER — Ambulatory Visit (INDEPENDENT_AMBULATORY_CARE_PROVIDER_SITE_OTHER): Payer: Self-pay | Admitting: *Deleted

## 2012-12-24 DIAGNOSIS — Z7901 Long term (current) use of anticoagulants: Secondary | ICD-10-CM

## 2012-12-24 DIAGNOSIS — I4892 Unspecified atrial flutter: Secondary | ICD-10-CM

## 2012-12-24 LAB — POCT INR: INR: 3.4

## 2013-01-09 ENCOUNTER — Other Ambulatory Visit: Payer: Self-pay | Admitting: Internal Medicine

## 2013-03-10 ENCOUNTER — Other Ambulatory Visit: Payer: Self-pay | Admitting: Family Medicine

## 2013-04-03 ENCOUNTER — Ambulatory Visit (INDEPENDENT_AMBULATORY_CARE_PROVIDER_SITE_OTHER): Payer: Self-pay | Admitting: *Deleted

## 2013-04-03 ENCOUNTER — Other Ambulatory Visit: Payer: Self-pay | Admitting: *Deleted

## 2013-04-03 DIAGNOSIS — I4892 Unspecified atrial flutter: Secondary | ICD-10-CM

## 2013-04-03 DIAGNOSIS — Z7901 Long term (current) use of anticoagulants: Secondary | ICD-10-CM

## 2013-04-03 MED ORDER — WARFARIN SODIUM 5 MG PO TABS: ORAL_TABLET | ORAL | Status: DC

## 2013-04-07 ENCOUNTER — Ambulatory Visit: Payer: Self-pay | Admitting: Family Medicine

## 2013-04-07 VITALS — BP 110/74 | HR 133 | Temp 98.5°F | Resp 18 | Ht 73.0 in | Wt 240.0 lb

## 2013-04-07 DIAGNOSIS — I471 Supraventricular tachycardia: Secondary | ICD-10-CM

## 2013-04-07 NOTE — Progress Notes (Signed)
52 year old gentleman who's had a cardioversion 2 years ago. He comes in because his girlfriend noticed that his pulse was racing for last month. He's been continuing to exercise without noticing any dyspnea on exertion. He's had no chest pain either. He's had no orthostatic changes giving him symptoms.  Objective: Patient has a pulse of 130 which is relatively regular. Chest: Clear Neck: Supple no adenopathy or bruits, no thyromegaly Heart: Regular, rapid, no gallop or murmur Extremities: No edema Skin: Without rash EKG:  Sinus tachycardia at 133 bpm  Assessment:  Patient with recurrent atrial tachycardia without C-V compromise.  Needs to see cardiologist in next 24 hours.  Plan:  Patient referred to Dr. Gala Romney urgently

## 2013-04-09 ENCOUNTER — Other Ambulatory Visit (HOSPITAL_COMMUNITY): Payer: Self-pay | Admitting: Cardiology

## 2013-04-09 DIAGNOSIS — I5022 Chronic systolic (congestive) heart failure: Secondary | ICD-10-CM

## 2013-04-09 MED ORDER — CARVEDILOL 12.5 MG PO TABS: ORAL_TABLET | ORAL | Status: DC

## 2013-04-10 ENCOUNTER — Other Ambulatory Visit (HOSPITAL_COMMUNITY): Payer: Self-pay | Admitting: Internal Medicine

## 2013-04-10 ENCOUNTER — Telehealth (HOSPITAL_COMMUNITY): Payer: Self-pay | Admitting: Cardiology

## 2013-04-10 DIAGNOSIS — I5022 Chronic systolic (congestive) heart failure: Secondary | ICD-10-CM

## 2013-04-11 ENCOUNTER — Ambulatory Visit (HOSPITAL_COMMUNITY): Payer: Self-pay | Attending: Cardiovascular Disease

## 2013-04-11 ENCOUNTER — Other Ambulatory Visit (HOSPITAL_COMMUNITY): Payer: Self-pay

## 2013-04-11 ENCOUNTER — Other Ambulatory Visit (HOSPITAL_COMMUNITY): Payer: Self-pay | Admitting: Internal Medicine

## 2013-04-11 DIAGNOSIS — I509 Heart failure, unspecified: Secondary | ICD-10-CM

## 2013-04-11 DIAGNOSIS — I498 Other specified cardiac arrhythmias: Secondary | ICD-10-CM | POA: Insufficient documentation

## 2013-04-11 DIAGNOSIS — I471 Supraventricular tachycardia: Secondary | ICD-10-CM

## 2013-04-11 DIAGNOSIS — I5022 Chronic systolic (congestive) heart failure: Secondary | ICD-10-CM

## 2013-04-11 DIAGNOSIS — I10 Essential (primary) hypertension: Secondary | ICD-10-CM | POA: Insufficient documentation

## 2013-04-11 DIAGNOSIS — E119 Type 2 diabetes mellitus without complications: Secondary | ICD-10-CM | POA: Insufficient documentation

## 2013-04-11 DIAGNOSIS — I319 Disease of pericardium, unspecified: Secondary | ICD-10-CM | POA: Insufficient documentation

## 2013-04-11 DIAGNOSIS — I079 Rheumatic tricuspid valve disease, unspecified: Secondary | ICD-10-CM | POA: Insufficient documentation

## 2013-04-11 NOTE — Progress Notes (Signed)
Echocardiogram performed.  

## 2013-04-15 ENCOUNTER — Encounter: Payer: Self-pay | Admitting: Internal Medicine

## 2013-04-15 ENCOUNTER — Encounter: Payer: Self-pay | Admitting: *Deleted

## 2013-04-15 ENCOUNTER — Ambulatory Visit (INDEPENDENT_AMBULATORY_CARE_PROVIDER_SITE_OTHER): Payer: Self-pay | Admitting: Internal Medicine

## 2013-04-15 VITALS — BP 117/76 | HR 132 | Ht 73.0 in | Wt 236.0 lb

## 2013-04-15 DIAGNOSIS — I5022 Chronic systolic (congestive) heart failure: Secondary | ICD-10-CM

## 2013-04-15 DIAGNOSIS — I4892 Unspecified atrial flutter: Secondary | ICD-10-CM

## 2013-04-15 MED ORDER — CARVEDILOL 12.5 MG PO TABS: ORAL_TABLET | ORAL | Status: DC

## 2013-04-15 NOTE — Patient Instructions (Addendum)
Your physician recommends that you schedule a follow-up appointment in: 6-8 weeks withDr Ladona Ridgel   Your physician has recommended that you have an ablation. Catheter ablation is a medical procedure used to treat some cardiac arrhythmias (irregular heartbeats). During catheter ablation, a long, thin, flexible tube is put into a blood vessel in your groin (upper thigh), or neck. This tube is called an ablation catheter. It is then guided to your heart through the blood vessel. Radio frequency waves destroy small areas of heart tissue where abnormal heartbeats may cause an arrhythmia to start. Please see the instruction sheet given to you today.---will need to wait until he has his insurace coverage  See instruction sheet  Your physician has recommended you make the following change in your medication:  1) Increase Carvedilol to 12.5mg   Take 1 1/2 tablets twice daily

## 2013-04-15 NOTE — Assessment & Plan Note (Signed)
This is a new problem. We will continue systemic anticoagulation. His ventricular rate is not yet well controlled, and we'll plan to increase his dose of beta blocker. I discussed the risk, goals, benefits, and expectations of catheter ablation with the patient. We'll plan on scheduling this procedure once  his insurance coverage will allow.

## 2013-04-15 NOTE — Addendum Note (Signed)
Addended by: Dennis Bast F on: 04/15/2013 11:45 AM   Modules accepted: Orders

## 2013-04-15 NOTE — Progress Notes (Signed)
HPI [name] returns today after a two-year absence from our arrhythmia clinic. He is a 52 year old man with a history of tachycardia palpitations and documented SVT who underwent catheter ablation of both atrial tachycardia as well as AV node reentrant tachycardia in may of 2012. He developed atrial flutter in July of 2012 and underwent cardioversion and did well until several weeks ago when he returned atrial flutter. When his heart is out of rhythm, his energy level is very poor and he has shortness of breath with exertion. He has not had syncope. His left ventricular function and flutter has decreased. He has not had syncope and denies chest pain. No peripheral edema. No Known Allergies   Current Outpatient Prescriptions  Medication Sig Dispense Refill  . allopurinol (ZYLOPRIM) 300 MG tablet Take 300 mg by mouth daily.      . carvedilol (COREG) 12.5 MG tablet TAKE ONE TABLET BY MOUTH TWICE DAILY WITH MEALS  60 tablet  0  . cetirizine (ZYRTEC) 10 MG tablet Take 10 mg by mouth daily.        . furosemide (LASIX) 40 MG tablet Take 1 tablet (40 mg total) by mouth 2 (two) times daily.  60 tablet  2  . lisinopril (PRINIVIL,ZESTRIL) 20 MG tablet TAKE ONE TABLET BY MOUTH ONCE DAILY  90 tablet  0  . metFORMIN (GLUCOPHAGE) 500 MG tablet Take 1 tablet (500 mg total) by mouth 2 (two) times daily with a meal.  180 tablet  3  . warfarin (COUMADIN) 5 MG tablet Take as directed by coumadin clinic  65 tablet  1  . colchicine 0.6 MG tablet Take 1 tablet (0.6 mg total) by mouth daily.  30 tablet  3   No current facility-administered medications for this visit.     Past Medical History  Diagnosis Date  . CHF (congestive heart failure)   . Edema   . HTN (hypertension)   . Heart murmur   . Seasonal allergies     ROS:   All systems reviewed and negative except as noted in the HPI.   Past Surgical History  Procedure Laterality Date  . Synovectomy       History reviewed. No pertinent family  history.   History   Social History  . Marital Status: Single    Spouse Name: N/A    Number of Children: N/A  . Years of Education: N/A   Occupational History  . Not on file.   Social History Main Topics  . Smoking status: Never Smoker   . Smokeless tobacco: Never Used  . Alcohol Use: Yes     Comment: occasionally  . Drug Use: No  . Sexual Activity: Not on file   Other Topics Concern  . Not on file   Social History Narrative  . No narrative on file     BP 117/76  Pulse 132  Ht 6\' 1"  (1.854 m)  Wt 236 lb (107.049 kg)  BMI 31.14 kg/m2  Physical Exam:  Well appearing 52 year old man, NAD HEENT: Unremarkable Neck:  7 cm JVD, no thyromegally Lungs:  Clear with no wheezes, rales, or rhonchi. HEART:  Regular rate rhythm, no murmurs, no rubs, no clicks Abd:  soft, positive bowel sounds, no organomegally, no rebound, no guarding Ext:  2 plus pulses, no edema, no cyanosis, no clubbing Skin:  No rashes no nodules Neuro:  CN II through XII intact, motor grossly intact  EKG - atrial flutter with 21 AV conduction.   Assess/Plan:

## 2013-04-28 ENCOUNTER — Ambulatory Visit (HOSPITAL_COMMUNITY): Admit: 2013-04-28 | Payer: Self-pay | Admitting: Internal Medicine

## 2013-04-28 ENCOUNTER — Encounter (HOSPITAL_COMMUNITY): Payer: Self-pay

## 2013-04-28 SURGERY — ATRIAL FLUTTER ABLATION
Anesthesia: LOCAL

## 2013-05-01 ENCOUNTER — Ambulatory Visit (INDEPENDENT_AMBULATORY_CARE_PROVIDER_SITE_OTHER): Payer: Self-pay | Admitting: *Deleted

## 2013-05-01 DIAGNOSIS — Z7901 Long term (current) use of anticoagulants: Secondary | ICD-10-CM

## 2013-05-01 DIAGNOSIS — I4892 Unspecified atrial flutter: Secondary | ICD-10-CM

## 2013-05-01 LAB — POCT INR: INR: 4.7

## 2013-05-02 ENCOUNTER — Other Ambulatory Visit: Payer: Self-pay

## 2013-05-02 MED ORDER — FUROSEMIDE 40 MG PO TABS: 40.0000 mg | ORAL_TABLET | Freq: Two times a day (BID) | ORAL | Status: DC

## 2013-05-05 ENCOUNTER — Encounter: Payer: Self-pay | Admitting: *Deleted

## 2013-05-05 ENCOUNTER — Other Ambulatory Visit: Payer: Self-pay | Admitting: Cardiology

## 2013-05-05 ENCOUNTER — Ambulatory Visit (INDEPENDENT_AMBULATORY_CARE_PROVIDER_SITE_OTHER): Payer: Self-pay | Admitting: Cardiovascular Disease

## 2013-05-05 ENCOUNTER — Encounter: Payer: Self-pay | Admitting: Cardiology

## 2013-05-05 ENCOUNTER — Ambulatory Visit (INDEPENDENT_AMBULATORY_CARE_PROVIDER_SITE_OTHER): Payer: Self-pay | Admitting: Cardiology

## 2013-05-05 VITALS — BP 98/72 | HR 126 | Ht 73.0 in | Wt 236.1 lb

## 2013-05-05 DIAGNOSIS — I10 Essential (primary) hypertension: Secondary | ICD-10-CM

## 2013-05-05 DIAGNOSIS — I4892 Unspecified atrial flutter: Secondary | ICD-10-CM

## 2013-05-05 DIAGNOSIS — Z7901 Long term (current) use of anticoagulants: Secondary | ICD-10-CM

## 2013-05-05 DIAGNOSIS — I428 Other cardiomyopathies: Secondary | ICD-10-CM

## 2013-05-05 DIAGNOSIS — I059 Rheumatic mitral valve disease, unspecified: Secondary | ICD-10-CM

## 2013-05-05 DIAGNOSIS — I340 Nonrheumatic mitral (valve) insufficiency: Secondary | ICD-10-CM

## 2013-05-05 DIAGNOSIS — I420 Dilated cardiomyopathy: Secondary | ICD-10-CM

## 2013-05-05 LAB — BASIC METABOLIC PANEL
BUN: 24 mg/dL — ABNORMAL HIGH (ref 6–23)
CO2: 28 mEq/L (ref 19–32)
Calcium: 9.3 mg/dL (ref 8.4–10.5)
Chloride: 100 mEq/L (ref 96–112)
Creatinine, Ser: 1.2 mg/dL (ref 0.4–1.5)
Glucose, Bld: 181 mg/dL — ABNORMAL HIGH (ref 70–99)
Sodium: 137 mEq/L (ref 135–145)

## 2013-05-05 LAB — PROTIME-INR: INR: 2 ratio — ABNORMAL HIGH (ref 0.8–1.0)

## 2013-05-05 MED ORDER — FUROSEMIDE 40 MG PO TABS: 40.0000 mg | ORAL_TABLET | Freq: Two times a day (BID) | ORAL | Status: DC

## 2013-05-05 NOTE — Patient Instructions (Addendum)
Your physician recommends that you schedule a follow-up appointment in: 4 WEEKS WITH DR Jens Som  Your physician has recommended that you have a Cardioversion (DCCV). Electrical Cardioversion uses a jolt of electricity to your heart either through paddles or wired patches attached to your chest. This is a controlled, usually prescheduled, procedure. Defibrillation is done under light anesthesia in the hospital, and you usually go home the day of the procedure. This is done to get your heart back into a normal rhythm. You are not awake for the procedure. Please see the instruction sheet given to you today.    Your physician recommends that you HAVE LAB WORK TODAY

## 2013-05-05 NOTE — Assessment & Plan Note (Signed)
Controlled. Continue present medications. 

## 2013-05-05 NOTE — Assessment & Plan Note (Signed)
Possibly related to annular dilatation. Repeat echocardiogram 3 months after sinus rhythm reestablished.

## 2013-05-05 NOTE — Assessment & Plan Note (Addendum)
Most likely tachycardia mediated. Check TSH. Continue ACE inhibitor and beta blocker. We'll need to repeat echocardiogram 3 months after sinus reestablished. If Ejection fraction less than 35% would need to consider ICD. Check potassium and renal function.

## 2013-05-05 NOTE — Assessment & Plan Note (Signed)
Patient remains in atrial flutter. His heart rate is elevated. His blood pressure is borderline and I therefore cannot advance AV nodal blocking agents. I think this is most likely causing a tachycardia mediated cardiomyopathy. He cannot proceed with ablation as he does not have insurance. However we can proceed with a cardioversion to reestablish sinus rhythm and improve tachycardia short-term. This would be less expensive and he could proceed with the ablation later. I do not want him to continue to be this tachycardic. He has been on Coumadin and his INR has been therapeutic. Recheck today. If 2 or greater plan cardioversion on September 24. Followup with Dr. Ladona Ridgel for scheduling ablation once insurance established.

## 2013-05-05 NOTE — Progress Notes (Signed)
HPI: FU cardiomyopathy and atrial flutter; admitted in May 2012 for low output HF with an EF 25%. Underwent cath in May 2012 with no significant CAD but EF ~15-20%. Found to have atrial tach. Underwent ablation by Dr. Ladona Ridgel of 2 atrial tachs and 1 AVNRT. Cardiomyopathy felt to be tachy induced. In July of 2012 he was found to have atrial flutter. He had TEE guided cardioversion. Patient saw Dr. Ladona Ridgel and a period of watchful waiting was recommended. However he developed recurrent atrial flutter recently. Dr. Ladona Ridgel again saw the patient and plans flutter ablation once patient's insurance allows. Last echocardiogram in August of 2014 showed an ejection fraction of 25-30% and mild left ventricular hypertrophy. Right ventricular function was mildly reduced. Mitral regurgitation was felt to be moderately severe. Since she was last seen he has dyspnea with extreme activities but not routine activities. No orthopnea, PND, pedal edema, palpitations, syncope or chest pain.  Current Outpatient Prescriptions  Medication Sig Dispense Refill  . allopurinol (ZYLOPRIM) 300 MG tablet Take 300 mg by mouth daily.      . carvedilol (COREG) 12.5 MG tablet Take 1 1/2 tablets twice daily  90 tablet  3  . cetirizine (ZYRTEC) 10 MG tablet Take 10 mg by mouth daily.        . colchicine 0.6 MG tablet Take 1 tablet (0.6 mg total) by mouth daily.  30 tablet  3  . furosemide (LASIX) 40 MG tablet Take 1 tablet (40 mg total) by mouth 2 (two) times daily.  60 tablet  2  . lisinopril (PRINIVIL,ZESTRIL) 20 MG tablet TAKE ONE TABLET BY MOUTH ONCE DAILY  90 tablet  0  . metFORMIN (GLUCOPHAGE) 500 MG tablet Take 1 tablet (500 mg total) by mouth 2 (two) times daily with a meal.  180 tablet  3  . warfarin (COUMADIN) 5 MG tablet Take as directed by coumadin clinic  65 tablet  1   No current facility-administered medications for this visit.     Past Medical History  Diagnosis Date  . CHF (congestive heart failure)   . HTN  (hypertension)   . Heart murmur   . Seasonal allergies   . Atrial flutter   . SVT (supraventricular tachycardia)   . Cardiomyopathy     Past Surgical History  Procedure Laterality Date  . Synovectomy      History   Social History  . Marital Status: Single    Spouse Name: N/A    Number of Children: N/A  . Years of Education: N/A   Occupational History  . Not on file.   Social History Main Topics  . Smoking status: Never Smoker   . Smokeless tobacco: Never Used  . Alcohol Use: Yes     Comment: occasionally  . Drug Use: No  . Sexual Activity: Not on file   Other Topics Concern  . Not on file   Social History Narrative  . No narrative on file    ROS: no fevers or chills, productive cough, hemoptysis, dysphasia, odynophagia, melena, hematochezia, dysuria, hematuria, rash, seizure activity, orthopnea, PND, pedal edema, claudication. Remaining systems are negative.  Physical Exam: Well-developed well-nourished in no acute distress.  Skin is warm and dry.  HEENT is normal.  Neck is supple.  Chest is clear to auscultation with normal expansion.  Cardiovascular exam is tachycardic and irregular Abdominal exam nontender or distended. No masses palpated. Extremities show no edema. neuro grossly intact  ECG atrial flutter with a ventricular response of 126. Nonspecific  ST changes.

## 2013-05-07 ENCOUNTER — Encounter (HOSPITAL_COMMUNITY): Payer: Self-pay | Admitting: *Deleted

## 2013-05-07 ENCOUNTER — Encounter (HOSPITAL_COMMUNITY): Payer: Self-pay | Admitting: Certified Registered"

## 2013-05-07 ENCOUNTER — Ambulatory Visit (HOSPITAL_COMMUNITY): Payer: Self-pay | Admitting: Certified Registered"

## 2013-05-07 ENCOUNTER — Ambulatory Visit (INDEPENDENT_AMBULATORY_CARE_PROVIDER_SITE_OTHER): Payer: Self-pay | Admitting: *Deleted

## 2013-05-07 ENCOUNTER — Encounter (HOSPITAL_COMMUNITY)
Admission: RE | Disposition: A | Discharge status: Home / Self Care | Payer: Self-pay | Source: Ambulatory Visit | Attending: Cardiovascular Disease

## 2013-05-07 ENCOUNTER — Ambulatory Visit (HOSPITAL_COMMUNITY)
Admission: RE | Admit: 2013-05-07 | Discharge: 2013-05-07 | Disposition: A | Discharge status: Home / Self Care | Payer: Self-pay | Source: Ambulatory Visit | Attending: Cardiovascular Disease | Admitting: Cardiovascular Disease

## 2013-05-07 DIAGNOSIS — I471 Supraventricular tachycardia: Secondary | ICD-10-CM

## 2013-05-07 DIAGNOSIS — I4892 Unspecified atrial flutter: Secondary | ICD-10-CM | POA: Insufficient documentation

## 2013-05-07 DIAGNOSIS — I10 Essential (primary) hypertension: Secondary | ICD-10-CM | POA: Insufficient documentation

## 2013-05-07 DIAGNOSIS — Z7901 Long term (current) use of anticoagulants: Secondary | ICD-10-CM | POA: Insufficient documentation

## 2013-05-07 DIAGNOSIS — I428 Other cardiomyopathies: Secondary | ICD-10-CM | POA: Insufficient documentation

## 2013-05-07 DIAGNOSIS — I4891 Unspecified atrial fibrillation: Secondary | ICD-10-CM

## 2013-05-07 DIAGNOSIS — E119 Type 2 diabetes mellitus without complications: Secondary | ICD-10-CM | POA: Insufficient documentation

## 2013-05-07 DIAGNOSIS — I509 Heart failure, unspecified: Secondary | ICD-10-CM | POA: Insufficient documentation

## 2013-05-07 HISTORY — PX: CARDIOVERSION: SHX1299

## 2013-05-07 LAB — BASIC METABOLIC PANEL
BUN: 28 mg/dL — ABNORMAL HIGH (ref 6–23)
Chloride: 99 mEq/L (ref 96–112)
GFR calc Af Amer: 88 mL/min — ABNORMAL LOW (ref >90)
Glucose, Bld: 136 mg/dL — ABNORMAL HIGH (ref 70–99)
Potassium: 3.5 mEq/L (ref 3.5–5.1)

## 2013-05-07 LAB — GLUCOSE, CAPILLARY: Glucose-Capillary: 148 mg/dL — ABNORMAL HIGH (ref 70–99)

## 2013-05-07 SURGERY — CARDIOVERSION
Anesthesia: Monitor Anesthesia Care

## 2013-05-07 MED ORDER — PROPOFOL 10 MG/ML IV BOLUS
INTRAVENOUS | Status: DC | PRN
  Administered 2013-05-07: 120 mg via INTRAVENOUS

## 2013-05-07 MED ORDER — LIDOCAINE HCL (CARDIAC) 20 MG/ML IV SOLN
INTRAVENOUS | Status: DC | PRN
  Administered 2013-05-07: 50 mg via INTRAVENOUS

## 2013-05-07 MED ORDER — SODIUM CHLORIDE 0.9 % IV SOLN
INTRAVENOUS | Status: DC
  Administered 2013-05-07 (×2): via INTRAVENOUS

## 2013-05-07 NOTE — CV Procedure (Signed)
DCC: On Rx coumadin INR 2.5 120J biphasic shock.  Converted from atrial flutter at rate 92 to NSR rate 72 130 mg of Propofol  Succesful DCC with no immediate neurologic sequelae Continue coumadin for at least 4 weeks  John Pham

## 2013-05-07 NOTE — CV Procedure (Signed)
DCC:  120mg  of propofol 120 J biphasic shock  Afflutter rate 92 to NSR rate 72 On Rx coumadin INR 2.5 No immediate neurologic sequelae  Charlton Haws

## 2013-05-07 NOTE — Op Note (Signed)
See CV procedure note  John Pham

## 2013-05-07 NOTE — Transfer of Care (Signed)
Immediate Anesthesia Transfer of Care Note  Patient: John Pham  Procedure(s) Performed: Procedure(s): CARDIOVERSION (N/A)  Patient Location: PACU and Endoscopy Unit  Anesthesia Type:General  Level of Consciousness: awake, alert  and oriented  Airway & Oxygen Therapy: Patient connected to nasal cannula oxygen  Post-op Assessment: Report given to PACU RN  Post vital signs: stable  Complications: No apparent anesthesia complications

## 2013-05-07 NOTE — Interval H&P Note (Signed)
History and Physical Interval Note:  05/07/2013 11:23 AM  John Pham  has presented today for surgery, with the diagnosis of AFIB  The various methods of treatment have been discussed with the patient and family. After consideration of risks, benefits and other options for treatment, the patient has consented to  Procedure(s): CARDIOVERSION (N/A) as a surgical intervention .  The patient's history has been reviewed, patient examined, no change in status, stable for surgery.  I have reviewed the patient's chart and labs.  Questions were answered to the patient's satisfaction.     Charlton Haws

## 2013-05-07 NOTE — H&P (View-Only) (Signed)
 HPI: FU cardiomyopathy and atrial flutter; admitted in May 2012 for low output HF with an EF 25%. Underwent cath in May 2012 with no significant CAD but EF ~15-20%. Found to have atrial tach. Underwent ablation by Dr. Taylor of 2 atrial tachs and 1 AVNRT. Cardiomyopathy felt to be tachy induced. In July of 2012 he was found to have atrial flutter. He had TEE guided cardioversion. Patient saw Dr. Taylor and a period of watchful waiting was recommended. However he developed recurrent atrial flutter recently. Dr. Taylor again saw the patient and plans flutter ablation once patient's insurance allows. Last echocardiogram in August of 2014 showed an ejection fraction of 25-30% and mild left ventricular hypertrophy. Right ventricular function was mildly reduced. Mitral regurgitation was felt to be moderately severe. Since she was last seen he has dyspnea with extreme activities but not routine activities. No orthopnea, PND, pedal edema, palpitations, syncope or chest pain.  Current Outpatient Prescriptions  Medication Sig Dispense Refill  . allopurinol (ZYLOPRIM) 300 MG tablet Take 300 mg by mouth daily.      . carvedilol (COREG) 12.5 MG tablet Take 1 1/2 tablets twice daily  90 tablet  3  . cetirizine (ZYRTEC) 10 MG tablet Take 10 mg by mouth daily.        . colchicine 0.6 MG tablet Take 1 tablet (0.6 mg total) by mouth daily.  30 tablet  3  . furosemide (LASIX) 40 MG tablet Take 1 tablet (40 mg total) by mouth 2 (two) times daily.  60 tablet  2  . lisinopril (PRINIVIL,ZESTRIL) 20 MG tablet TAKE ONE TABLET BY MOUTH ONCE DAILY  90 tablet  0  . metFORMIN (GLUCOPHAGE) 500 MG tablet Take 1 tablet (500 mg total) by mouth 2 (two) times daily with a meal.  180 tablet  3  . warfarin (COUMADIN) 5 MG tablet Take as directed by coumadin clinic  65 tablet  1   No current facility-administered medications for this visit.     Past Medical History  Diagnosis Date  . CHF (congestive heart failure)   . HTN  (hypertension)   . Heart murmur   . Seasonal allergies   . Atrial flutter   . SVT (supraventricular tachycardia)   . Cardiomyopathy     Past Surgical History  Procedure Laterality Date  . Synovectomy      History   Social History  . Marital Status: Single    Spouse Name: N/A    Number of Children: N/A  . Years of Education: N/A   Occupational History  . Not on file.   Social History Main Topics  . Smoking status: Never Smoker   . Smokeless tobacco: Never Used  . Alcohol Use: Yes     Comment: occasionally  . Drug Use: No  . Sexual Activity: Not on file   Other Topics Concern  . Not on file   Social History Narrative  . No narrative on file    ROS: no fevers or chills, productive cough, hemoptysis, dysphasia, odynophagia, melena, hematochezia, dysuria, hematuria, rash, seizure activity, orthopnea, PND, pedal edema, claudication. Remaining systems are negative.  Physical Exam: Well-developed well-nourished in no acute distress.  Skin is warm and dry.  HEENT is normal.  Neck is supple.  Chest is clear to auscultation with normal expansion.  Cardiovascular exam is tachycardic and irregular Abdominal exam nontender or distended. No masses palpated. Extremities show no edema. neuro grossly intact  ECG atrial flutter with a ventricular response of 126. Nonspecific   ST changes.     

## 2013-05-07 NOTE — Anesthesia Preprocedure Evaluation (Addendum)
Anesthesia Evaluation  Patient identified by MRN, date of birth, ID band Patient awake    Reviewed: Allergy & Precautions, H&P , NPO status , Patient's Chart, lab work & pertinent test results, reviewed documented beta blocker date and time   Airway Mallampati: I TM Distance: >3 FB     Dental  (+) Teeth Intact and Dental Advisory Given   Pulmonary  breath sounds clear to auscultation        Cardiovascular hypertension, +CHF + dysrhythmias + Valvular Problems/Murmurs Rhythm:Irregular Rate:Normal     Neuro/Psych    GI/Hepatic   Endo/Other  diabetes, Well Controlled, Type 2, Oral Hypoglycemic Agents  Renal/GU      Musculoskeletal   Abdominal (+)  Abdomen: soft. Bowel sounds: normal.  Peds  Hematology   Anesthesia Other Findings   Reproductive/Obstetrics                        Anesthesia Physical Anesthesia Plan  ASA: III  Anesthesia Plan: General   Post-op Pain Management:    Induction: Intravenous  Airway Management Planned: Mask  Additional Equipment:   Intra-op Plan:   Post-operative Plan:   Informed Consent: I have reviewed the patients History and Physical, chart, labs and discussed the procedure including the risks, benefits and alternatives for the proposed anesthesia with the patient or authorized representative who has indicated his/her understanding and acceptance.   Dental advisory given and History available from chart only  Plan Discussed with: CRNA and Anesthesiologist  Anesthesia Plan Comments:        Anesthesia Quick Evaluation

## 2013-05-07 NOTE — Anesthesia Postprocedure Evaluation (Signed)
  Anesthesia Post-op Note  Patient: John Pham  Procedure(s) Performed: Procedure(s): CARDIOVERSION (N/A)  Patient Location: PACU and Endoscopy Unit  Anesthesia Type:General  Level of Consciousness: awake, alert  and oriented  Airway and Oxygen Therapy: Patient connected to nasal cannula oxygen  Post-op Pain: none  Post-op Assessment: Patient's Cardiovascular Status Stable  Post-op Vital Signs: stable  Complications: No apparent anesthesia complications

## 2013-05-07 NOTE — Preoperative (Signed)
Beta Blockers   Reason not to administer Beta Blockers:Not Applicable 

## 2013-05-08 ENCOUNTER — Encounter (HOSPITAL_COMMUNITY): Payer: Self-pay | Admitting: Cardiovascular Disease

## 2013-05-15 ENCOUNTER — Ambulatory Visit (INDEPENDENT_AMBULATORY_CARE_PROVIDER_SITE_OTHER): Payer: Self-pay | Admitting: *Deleted

## 2013-05-15 DIAGNOSIS — Z7901 Long term (current) use of anticoagulants: Secondary | ICD-10-CM

## 2013-05-15 DIAGNOSIS — I4892 Unspecified atrial flutter: Secondary | ICD-10-CM

## 2013-05-15 LAB — POCT INR: INR: 2.7

## 2013-05-29 ENCOUNTER — Ambulatory Visit (INDEPENDENT_AMBULATORY_CARE_PROVIDER_SITE_OTHER): Payer: Self-pay | Admitting: Pharmacist

## 2013-05-29 DIAGNOSIS — Z7901 Long term (current) use of anticoagulants: Secondary | ICD-10-CM

## 2013-05-29 DIAGNOSIS — I4892 Unspecified atrial flutter: Secondary | ICD-10-CM

## 2013-06-15 ENCOUNTER — Other Ambulatory Visit: Payer: Self-pay | Admitting: Family Medicine

## 2013-06-26 ENCOUNTER — Encounter (INDEPENDENT_AMBULATORY_CARE_PROVIDER_SITE_OTHER): Payer: Self-pay

## 2013-06-26 ENCOUNTER — Ambulatory Visit (INDEPENDENT_AMBULATORY_CARE_PROVIDER_SITE_OTHER): Payer: Self-pay | Admitting: *Deleted

## 2013-06-26 DIAGNOSIS — I4892 Unspecified atrial flutter: Secondary | ICD-10-CM

## 2013-06-26 DIAGNOSIS — Z7901 Long term (current) use of anticoagulants: Secondary | ICD-10-CM

## 2013-06-26 LAB — POCT INR: INR: 4.1

## 2013-07-02 ENCOUNTER — Ambulatory Visit: Payer: Self-pay | Admitting: Internal Medicine

## 2013-07-07 ENCOUNTER — Other Ambulatory Visit: Payer: Self-pay | Admitting: *Deleted

## 2013-07-07 MED ORDER — WARFARIN SODIUM 5 MG PO TABS: ORAL_TABLET | ORAL | Status: DC

## 2013-07-24 ENCOUNTER — Other Ambulatory Visit: Payer: Self-pay | Admitting: Family Medicine

## 2013-09-19 ENCOUNTER — Other Ambulatory Visit: Payer: Self-pay | Admitting: Family Medicine

## 2013-09-24 ENCOUNTER — Other Ambulatory Visit: Payer: Self-pay | Admitting: Family Medicine

## 2013-12-08 ENCOUNTER — Ambulatory Visit (INDEPENDENT_AMBULATORY_CARE_PROVIDER_SITE_OTHER): Payer: No Typology Code available for payment source | Admitting: Internal Medicine

## 2013-12-08 VITALS — BP 146/92 | HR 88 | Temp 98.2°F | Resp 18 | Ht 71.5 in | Wt 242.8 lb

## 2013-12-08 DIAGNOSIS — I340 Nonrheumatic mitral (valve) insufficiency: Secondary | ICD-10-CM

## 2013-12-08 DIAGNOSIS — I4892 Unspecified atrial flutter: Secondary | ICD-10-CM

## 2013-12-08 DIAGNOSIS — I10 Essential (primary) hypertension: Secondary | ICD-10-CM

## 2013-12-08 DIAGNOSIS — E119 Type 2 diabetes mellitus without complications: Secondary | ICD-10-CM

## 2013-12-08 DIAGNOSIS — I5022 Chronic systolic (congestive) heart failure: Secondary | ICD-10-CM

## 2013-12-08 DIAGNOSIS — M109 Gout, unspecified: Secondary | ICD-10-CM

## 2013-12-08 DIAGNOSIS — I420 Dilated cardiomyopathy: Secondary | ICD-10-CM

## 2013-12-08 MED ORDER — WARFARIN SODIUM 5 MG PO TABS: ORAL_TABLET | ORAL | Status: DC

## 2013-12-08 MED ORDER — ALLOPURINOL 300 MG PO TABS: 300.0000 mg | ORAL_TABLET | Freq: Every day | ORAL | Status: AC

## 2013-12-08 MED ORDER — LISINOPRIL 20 MG PO TABS: 20.0000 mg | ORAL_TABLET | Freq: Every day | ORAL | Status: DC

## 2013-12-08 MED ORDER — METFORMIN HCL 500 MG PO TABS: 500.0000 mg | ORAL_TABLET | Freq: Two times a day (BID) | ORAL | Status: DC

## 2013-12-08 MED ORDER — CARVEDILOL 12.5 MG PO TABS: ORAL_TABLET | ORAL | Status: DC

## 2013-12-08 MED ORDER — FUROSEMIDE 40 MG PO TABS
40.0000 mg | ORAL_TABLET | Freq: Two times a day (BID) | ORAL | Status: AC
Start: 2013-12-08 — End: ?

## 2013-12-08 MED ORDER — COLCHICINE 0.6 MG PO TABS: 0.6000 mg | ORAL_TABLET | Freq: Two times a day (BID) | ORAL | Status: AC

## 2013-12-08 NOTE — Progress Notes (Signed)
   Subjective:    Patient ID: John Pham, male    DOB: Jan 06, 1961, 53 y.o.   MRN: [id number]  HPI Pt has 2 chief complaints.   first he is having pain and swelling of the right elbow which feel like his gout.  Pain onset 2 days ago with increased pain at bedtime. Pain is 8/10. No injury to elbow no fever, has had previous gout in that elbow.  Pain is increased with motion.   Also he c/o he has run out of his medication or only has a few left because he ran out of his insurance and could not come to the dr. He has an appointment with his primary care dr in one month and now has his insurance back again.  Review of Systems  Constitutional: Negative for fever, chills, diaphoresis, activity change, appetite change, fatigue and unexpected weight change.  Musculoskeletal: Positive for joint swelling.       Right elbow pain and swelling  All other systems reviewed and are negative.      Objective:   Physical Exam  Nursing note and vitals reviewed. Constitutional: He is oriented to person, place, and time. He appears well-developed and well-nourished.  HENT:  Head: Normocephalic and atraumatic.  Nose: Nose normal.  Mouth/Throat: Oropharynx is clear and moist.  Eyes: Conjunctivae and EOM are normal. Pupils are equal, round, and reactive to light.  Neck: Normal range of motion. Neck supple.  Cardiovascular: Normal rate, regular rhythm, normal heart sounds and intact distal pulses.   regular rate and rhytm  Pulmonary/Chest: Effort normal and breath sounds normal.  Abdominal: Soft. Bowel sounds are normal.  Musculoskeletal: He exhibits tenderness.  Tenderness swelling and slight effusion of the right elbow  Neurological: He is alert and oriented to person, place, and time.  Skin: Skin is warm and dry.  Psychiatric: He has a normal mood and affect. His behavior is normal. Judgment and thought content normal.          Assessment & Plan:  Pt is having a flare of gout in the right  elbow. Will start colchicine for the acute attack and then restart the allopurinol when the acute attach has resolved.  Will refill pt rx as he has an appointment with his primary care dr in one month/. Noted on the physical exam is that the patient has a regular rate and rhythm. He states he is on the coumadin for afib and that he was electrically cardioverted int he past but has had intermittent irregular heart beats.  Instructed pt that this needs to be reevaluated by his dr this month in his exam.  He states he is having his pt evaluated on a monthly basis and that his dr has been working with him financially so he gets this done regularly.

## 2013-12-08 NOTE — Patient Instructions (Addendum)
Take your meds as directed.see your dr in follow up as directed.start the allopurinol in 3 days when you are finishing the colchicine. If your symptoms worsen or if you developo any new symptoms  return to the office.

## 2013-12-17 ENCOUNTER — Ambulatory Visit (INDEPENDENT_AMBULATORY_CARE_PROVIDER_SITE_OTHER): Payer: No Typology Code available for payment source | Admitting: *Deleted

## 2013-12-17 DIAGNOSIS — Z7901 Long term (current) use of anticoagulants: Secondary | ICD-10-CM

## 2013-12-17 DIAGNOSIS — I4892 Unspecified atrial flutter: Secondary | ICD-10-CM

## 2013-12-17 LAB — POCT INR: INR: 2.5

## 2013-12-17 MED ORDER — WARFARIN SODIUM 5 MG PO TABS: ORAL_TABLET | ORAL | Status: DC

## 2014-01-07 ENCOUNTER — Encounter: Payer: Self-pay | Admitting: Internal Medicine

## 2014-01-15 ENCOUNTER — Ambulatory Visit: Payer: No Typology Code available for payment source | Admitting: Internal Medicine

## 2014-01-26 ENCOUNTER — Other Ambulatory Visit: Payer: Self-pay | Admitting: Internal Medicine

## 2014-02-02 ENCOUNTER — Other Ambulatory Visit: Payer: Self-pay | Admitting: Internal Medicine

## 2014-02-05 ENCOUNTER — Ambulatory Visit (INDEPENDENT_AMBULATORY_CARE_PROVIDER_SITE_OTHER): Payer: No Typology Code available for payment source | Admitting: *Deleted

## 2014-02-05 ENCOUNTER — Ambulatory Visit: Payer: No Typology Code available for payment source | Admitting: Internal Medicine

## 2014-02-05 DIAGNOSIS — Z7901 Long term (current) use of anticoagulants: Secondary | ICD-10-CM

## 2014-02-05 LAB — POCT INR: INR: 3.3

## 2014-02-25 ENCOUNTER — Ambulatory Visit (INDEPENDENT_AMBULATORY_CARE_PROVIDER_SITE_OTHER): Payer: No Typology Code available for payment source | Admitting: Pharmacist

## 2014-02-25 DIAGNOSIS — Z7901 Long term (current) use of anticoagulants: Secondary | ICD-10-CM

## 2014-02-25 DIAGNOSIS — I4892 Unspecified atrial flutter: Secondary | ICD-10-CM

## 2014-02-25 LAB — POCT INR: INR: 4.1

## 2014-03-11 ENCOUNTER — Ambulatory Visit (INDEPENDENT_AMBULATORY_CARE_PROVIDER_SITE_OTHER): Payer: No Typology Code available for payment source | Admitting: *Deleted

## 2014-03-11 DIAGNOSIS — I4892 Unspecified atrial flutter: Secondary | ICD-10-CM

## 2014-03-11 DIAGNOSIS — Z7901 Long term (current) use of anticoagulants: Secondary | ICD-10-CM

## 2014-03-11 LAB — POCT INR: INR: 1.4

## 2014-03-27 ENCOUNTER — Ambulatory Visit (INDEPENDENT_AMBULATORY_CARE_PROVIDER_SITE_OTHER): Payer: Self-pay | Admitting: Pharmacist

## 2014-03-27 DIAGNOSIS — I4892 Unspecified atrial flutter: Secondary | ICD-10-CM

## 2014-03-27 DIAGNOSIS — Z7901 Long term (current) use of anticoagulants: Secondary | ICD-10-CM

## 2014-03-27 LAB — POCT INR: INR: 1.6

## 2014-04-16 ENCOUNTER — Ambulatory Visit (INDEPENDENT_AMBULATORY_CARE_PROVIDER_SITE_OTHER): Payer: Self-pay | Admitting: Pharmacist

## 2014-04-16 DIAGNOSIS — Z7901 Long term (current) use of anticoagulants: Secondary | ICD-10-CM

## 2014-04-16 DIAGNOSIS — I4892 Unspecified atrial flutter: Secondary | ICD-10-CM

## 2014-04-16 LAB — POCT INR: INR: 2.6

## 2014-05-14 ENCOUNTER — Ambulatory Visit (INDEPENDENT_AMBULATORY_CARE_PROVIDER_SITE_OTHER): Payer: No Typology Code available for payment source

## 2014-05-14 DIAGNOSIS — Z7901 Long term (current) use of anticoagulants: Secondary | ICD-10-CM

## 2014-05-14 DIAGNOSIS — I4892 Unspecified atrial flutter: Secondary | ICD-10-CM

## 2014-05-14 LAB — POCT INR: INR: 2.6

## 2014-05-14 MED ORDER — WARFARIN SODIUM 5 MG PO TABS: ORAL_TABLET | ORAL | Status: DC

## 2014-05-20 ENCOUNTER — Ambulatory Visit: Payer: No Typology Code available for payment source | Admitting: Internal Medicine

## 2014-06-26 ENCOUNTER — Ambulatory Visit (INDEPENDENT_AMBULATORY_CARE_PROVIDER_SITE_OTHER): Payer: No Typology Code available for payment source | Admitting: *Deleted

## 2014-06-26 DIAGNOSIS — I4892 Unspecified atrial flutter: Secondary | ICD-10-CM

## 2014-06-26 DIAGNOSIS — Z7901 Long term (current) use of anticoagulants: Secondary | ICD-10-CM

## 2014-06-26 LAB — POCT INR: INR: 3.2

## 2014-07-17 ENCOUNTER — Ambulatory Visit (INDEPENDENT_AMBULATORY_CARE_PROVIDER_SITE_OTHER): Payer: No Typology Code available for payment source

## 2014-07-17 DIAGNOSIS — I4892 Unspecified atrial flutter: Secondary | ICD-10-CM

## 2014-07-17 DIAGNOSIS — Z7901 Long term (current) use of anticoagulants: Secondary | ICD-10-CM

## 2014-07-17 LAB — POCT INR: INR: 2

## 2014-07-27 ENCOUNTER — Telehealth: Payer: Self-pay

## 2014-07-27 NOTE — Telephone Encounter (Signed)
LMVM reminding patient to have his flu shot. 

## 2014-08-18 ENCOUNTER — Ambulatory Visit (INDEPENDENT_AMBULATORY_CARE_PROVIDER_SITE_OTHER): Payer: Self-pay | Admitting: Pharmacist

## 2014-08-18 DIAGNOSIS — Z7901 Long term (current) use of anticoagulants: Secondary | ICD-10-CM

## 2014-08-18 DIAGNOSIS — I4892 Unspecified atrial flutter: Secondary | ICD-10-CM

## 2014-08-18 LAB — POCT INR: INR: 2.6

## 2014-09-25 ENCOUNTER — Ambulatory Visit (INDEPENDENT_AMBULATORY_CARE_PROVIDER_SITE_OTHER): Payer: Self-pay | Admitting: Pharmacist

## 2014-09-25 DIAGNOSIS — Z7901 Long term (current) use of anticoagulants: Secondary | ICD-10-CM

## 2014-09-25 DIAGNOSIS — I4892 Unspecified atrial flutter: Secondary | ICD-10-CM

## 2014-09-25 LAB — POCT INR: INR: 2.7

## 2014-09-25 MED ORDER — WARFARIN SODIUM 5 MG PO TABS: ORAL_TABLET | ORAL | Status: DC

## 2014-11-12 ENCOUNTER — Ambulatory Visit (INDEPENDENT_AMBULATORY_CARE_PROVIDER_SITE_OTHER): Payer: Self-pay | Admitting: *Deleted

## 2014-11-12 DIAGNOSIS — Z7901 Long term (current) use of anticoagulants: Secondary | ICD-10-CM

## 2014-11-12 DIAGNOSIS — I4892 Unspecified atrial flutter: Secondary | ICD-10-CM

## 2014-11-12 LAB — POCT INR: INR: 3.8

## 2014-11-26 ENCOUNTER — Ambulatory Visit (INDEPENDENT_AMBULATORY_CARE_PROVIDER_SITE_OTHER): Payer: Self-pay | Admitting: *Deleted

## 2014-11-26 DIAGNOSIS — I4892 Unspecified atrial flutter: Secondary | ICD-10-CM

## 2014-11-26 DIAGNOSIS — Z7901 Long term (current) use of anticoagulants: Secondary | ICD-10-CM

## 2014-11-26 LAB — POCT INR: INR: 2.9

## 2014-12-18 ENCOUNTER — Ambulatory Visit (INDEPENDENT_AMBULATORY_CARE_PROVIDER_SITE_OTHER): Payer: No Typology Code available for payment source | Admitting: *Deleted

## 2014-12-18 DIAGNOSIS — Z7901 Long term (current) use of anticoagulants: Secondary | ICD-10-CM

## 2014-12-18 DIAGNOSIS — I4892 Unspecified atrial flutter: Secondary | ICD-10-CM

## 2014-12-18 LAB — POCT INR: INR: 3.9

## 2015-01-14 ENCOUNTER — Ambulatory Visit (INDEPENDENT_AMBULATORY_CARE_PROVIDER_SITE_OTHER): Payer: No Typology Code available for payment source

## 2015-01-14 DIAGNOSIS — Z7901 Long term (current) use of anticoagulants: Secondary | ICD-10-CM | POA: Diagnosis not present

## 2015-01-14 DIAGNOSIS — I4892 Unspecified atrial flutter: Secondary | ICD-10-CM

## 2015-01-14 LAB — POCT INR: INR: 2.9

## 2015-01-14 MED ORDER — WARFARIN SODIUM 5 MG PO TABS: ORAL_TABLET | ORAL | Status: DC

## 2015-02-11 ENCOUNTER — Ambulatory Visit (INDEPENDENT_AMBULATORY_CARE_PROVIDER_SITE_OTHER): Payer: No Typology Code available for payment source | Admitting: *Deleted

## 2015-02-11 DIAGNOSIS — Z7901 Long term (current) use of anticoagulants: Secondary | ICD-10-CM

## 2015-02-11 DIAGNOSIS — I4892 Unspecified atrial flutter: Secondary | ICD-10-CM | POA: Diagnosis not present

## 2015-02-11 LAB — POCT INR: INR: 2.3

## 2015-02-17 ENCOUNTER — Telehealth: Payer: Self-pay

## 2015-02-17 DIAGNOSIS — I420 Dilated cardiomyopathy: Secondary | ICD-10-CM

## 2015-02-17 DIAGNOSIS — I10 Essential (primary) hypertension: Secondary | ICD-10-CM

## 2015-02-17 DIAGNOSIS — I5022 Chronic systolic (congestive) heart failure: Secondary | ICD-10-CM

## 2015-02-17 NOTE — Telephone Encounter (Signed)
[facility] Furosemide 40 MG for pat. John Pham.  It was prescribed by an Urgent Care about a year ago.  They would not okay refill due to the time and advised John Pham to contact his cardiologist for a refill.  John Pham will fax info to us.

## 2015-02-18 ENCOUNTER — Other Ambulatory Visit: Payer: Self-pay | Admitting: *Deleted

## 2015-02-18 DIAGNOSIS — I420 Dilated cardiomyopathy: Secondary | ICD-10-CM

## 2015-02-18 DIAGNOSIS — I5022 Chronic systolic (congestive) heart failure: Secondary | ICD-10-CM

## 2015-02-18 NOTE — Telephone Encounter (Signed)
Will need paov prior to refill Olga MillersBrian Crenshaw

## 2015-02-24 NOTE — Telephone Encounter (Signed)
He will need a followup appointment, perhaps with a PA/NP if possible. GT

## 2015-02-25 NOTE — Telephone Encounter (Signed)
FORWARD TO CV NL POOL 

## 2015-02-25 NOTE — Telephone Encounter (Signed)
Furosemide refill refused - las refilled by Urgent Care - has not seen cardiologist since 04/2013.  Attempted to contact patient - no answer - no name-verified VM

## 2015-03-19 ENCOUNTER — Ambulatory Visit (INDEPENDENT_AMBULATORY_CARE_PROVIDER_SITE_OTHER): Payer: No Typology Code available for payment source | Admitting: *Deleted

## 2015-03-19 DIAGNOSIS — Z7901 Long term (current) use of anticoagulants: Secondary | ICD-10-CM | POA: Diagnosis not present

## 2015-03-19 DIAGNOSIS — I4892 Unspecified atrial flutter: Secondary | ICD-10-CM | POA: Diagnosis not present

## 2015-03-19 LAB — POCT INR: INR: 2.8

## 2015-05-10 ENCOUNTER — Ambulatory Visit (INDEPENDENT_AMBULATORY_CARE_PROVIDER_SITE_OTHER): Payer: No Typology Code available for payment source | Admitting: Pharmacist

## 2015-05-10 DIAGNOSIS — I4892 Unspecified atrial flutter: Secondary | ICD-10-CM | POA: Diagnosis not present

## 2015-05-10 DIAGNOSIS — Z7901 Long term (current) use of anticoagulants: Secondary | ICD-10-CM | POA: Diagnosis not present

## 2015-05-10 LAB — POCT INR: INR: 1.8

## 2015-06-08 ENCOUNTER — Other Ambulatory Visit: Payer: Self-pay | Admitting: Internal Medicine

## 2015-06-28 ENCOUNTER — Ambulatory Visit (INDEPENDENT_AMBULATORY_CARE_PROVIDER_SITE_OTHER): Payer: No Typology Code available for payment source | Admitting: *Deleted

## 2015-06-28 DIAGNOSIS — I4892 Unspecified atrial flutter: Secondary | ICD-10-CM | POA: Diagnosis not present

## 2015-06-28 DIAGNOSIS — Z7901 Long term (current) use of anticoagulants: Secondary | ICD-10-CM | POA: Diagnosis not present

## 2015-06-28 LAB — POCT INR: INR: 3.3

## 2015-08-19 ENCOUNTER — Ambulatory Visit (INDEPENDENT_AMBULATORY_CARE_PROVIDER_SITE_OTHER): Payer: BLUE CROSS/BLUE SHIELD | Admitting: *Deleted

## 2015-08-19 DIAGNOSIS — I4892 Unspecified atrial flutter: Secondary | ICD-10-CM

## 2015-08-19 DIAGNOSIS — Z7901 Long term (current) use of anticoagulants: Secondary | ICD-10-CM | POA: Diagnosis not present

## 2015-08-19 LAB — POCT INR: INR: 2.6

## 2015-09-27 ENCOUNTER — Ambulatory Visit (INDEPENDENT_AMBULATORY_CARE_PROVIDER_SITE_OTHER): Payer: BLUE CROSS/BLUE SHIELD | Admitting: *Deleted

## 2015-09-27 DIAGNOSIS — I4892 Unspecified atrial flutter: Secondary | ICD-10-CM

## 2015-09-27 DIAGNOSIS — Z7901 Long term (current) use of anticoagulants: Secondary | ICD-10-CM

## 2015-09-27 LAB — POCT INR: INR: 2.8

## 2015-09-27 MED ORDER — WARFARIN SODIUM 5 MG PO TABS: ORAL_TABLET | ORAL | Status: DC

## 2015-11-08 ENCOUNTER — Ambulatory Visit (INDEPENDENT_AMBULATORY_CARE_PROVIDER_SITE_OTHER): Payer: BLUE CROSS/BLUE SHIELD | Admitting: *Deleted

## 2015-11-08 DIAGNOSIS — Z7901 Long term (current) use of anticoagulants: Secondary | ICD-10-CM | POA: Diagnosis not present

## 2015-11-08 DIAGNOSIS — I4892 Unspecified atrial flutter: Secondary | ICD-10-CM | POA: Diagnosis not present

## 2015-11-08 LAB — POCT INR: INR: 3.7

## 2015-12-15 ENCOUNTER — Ambulatory Visit (INDEPENDENT_AMBULATORY_CARE_PROVIDER_SITE_OTHER): Payer: BLUE CROSS/BLUE SHIELD | Admitting: *Deleted

## 2015-12-15 DIAGNOSIS — I4892 Unspecified atrial flutter: Secondary | ICD-10-CM | POA: Diagnosis not present

## 2015-12-15 DIAGNOSIS — Z7901 Long term (current) use of anticoagulants: Secondary | ICD-10-CM | POA: Diagnosis not present

## 2015-12-15 LAB — POCT INR: INR: 3.4

## 2015-12-29 ENCOUNTER — Ambulatory Visit (INDEPENDENT_AMBULATORY_CARE_PROVIDER_SITE_OTHER): Payer: BLUE CROSS/BLUE SHIELD | Admitting: *Deleted

## 2015-12-29 DIAGNOSIS — Z7901 Long term (current) use of anticoagulants: Secondary | ICD-10-CM | POA: Diagnosis not present

## 2015-12-29 DIAGNOSIS — I4892 Unspecified atrial flutter: Secondary | ICD-10-CM | POA: Diagnosis not present

## 2015-12-29 LAB — POCT INR: INR: 2.4

## 2016-01-20 ENCOUNTER — Ambulatory Visit (INDEPENDENT_AMBULATORY_CARE_PROVIDER_SITE_OTHER): Payer: BLUE CROSS/BLUE SHIELD | Admitting: *Deleted

## 2016-01-20 DIAGNOSIS — I4892 Unspecified atrial flutter: Secondary | ICD-10-CM | POA: Diagnosis not present

## 2016-01-20 DIAGNOSIS — Z7901 Long term (current) use of anticoagulants: Secondary | ICD-10-CM | POA: Diagnosis not present

## 2016-01-20 LAB — POCT INR: INR: 2.7

## 2016-02-10 ENCOUNTER — Other Ambulatory Visit: Payer: Self-pay | Admitting: Internal Medicine

## 2016-05-09 ENCOUNTER — Ambulatory Visit (INDEPENDENT_AMBULATORY_CARE_PROVIDER_SITE_OTHER): Payer: BLUE CROSS/BLUE SHIELD

## 2016-05-09 DIAGNOSIS — Z7901 Long term (current) use of anticoagulants: Secondary | ICD-10-CM | POA: Diagnosis not present

## 2016-05-09 DIAGNOSIS — I4892 Unspecified atrial flutter: Secondary | ICD-10-CM

## 2016-05-09 LAB — POCT INR: INR: 3.8

## 2016-05-24 ENCOUNTER — Ambulatory Visit (INDEPENDENT_AMBULATORY_CARE_PROVIDER_SITE_OTHER): Payer: BLUE CROSS/BLUE SHIELD | Admitting: *Deleted

## 2016-05-24 DIAGNOSIS — Z7901 Long term (current) use of anticoagulants: Secondary | ICD-10-CM

## 2016-05-24 DIAGNOSIS — I4892 Unspecified atrial flutter: Secondary | ICD-10-CM

## 2016-05-24 LAB — POCT INR: INR: 3.2

## 2016-06-13 ENCOUNTER — Other Ambulatory Visit: Payer: Self-pay | Admitting: Internal Medicine

## 2016-06-20 ENCOUNTER — Ambulatory Visit (INDEPENDENT_AMBULATORY_CARE_PROVIDER_SITE_OTHER): Payer: BLUE CROSS/BLUE SHIELD | Admitting: Pharmacist

## 2016-06-20 DIAGNOSIS — I4892 Unspecified atrial flutter: Secondary | ICD-10-CM | POA: Diagnosis not present

## 2016-06-20 DIAGNOSIS — Z7901 Long term (current) use of anticoagulants: Secondary | ICD-10-CM | POA: Diagnosis not present

## 2016-06-20 LAB — POCT INR: INR: 5.4

## 2016-06-29 ENCOUNTER — Ambulatory Visit (INDEPENDENT_AMBULATORY_CARE_PROVIDER_SITE_OTHER): Payer: BLUE CROSS/BLUE SHIELD

## 2016-06-29 DIAGNOSIS — Z7901 Long term (current) use of anticoagulants: Secondary | ICD-10-CM

## 2016-06-29 DIAGNOSIS — I4892 Unspecified atrial flutter: Secondary | ICD-10-CM

## 2016-06-29 LAB — POCT INR: INR: 3.3

## 2016-07-12 ENCOUNTER — Encounter (INDEPENDENT_AMBULATORY_CARE_PROVIDER_SITE_OTHER): Payer: Self-pay

## 2016-07-12 ENCOUNTER — Ambulatory Visit (INDEPENDENT_AMBULATORY_CARE_PROVIDER_SITE_OTHER): Payer: BLUE CROSS/BLUE SHIELD | Admitting: *Deleted

## 2016-07-12 DIAGNOSIS — Z7901 Long term (current) use of anticoagulants: Secondary | ICD-10-CM | POA: Diagnosis not present

## 2016-07-12 DIAGNOSIS — I4892 Unspecified atrial flutter: Secondary | ICD-10-CM | POA: Diagnosis not present

## 2016-07-12 LAB — POCT INR: INR: 3

## 2016-08-02 ENCOUNTER — Ambulatory Visit (INDEPENDENT_AMBULATORY_CARE_PROVIDER_SITE_OTHER): Payer: BLUE CROSS/BLUE SHIELD | Admitting: *Deleted

## 2016-08-02 DIAGNOSIS — Z7901 Long term (current) use of anticoagulants: Secondary | ICD-10-CM | POA: Diagnosis not present

## 2016-08-02 DIAGNOSIS — I4892 Unspecified atrial flutter: Secondary | ICD-10-CM | POA: Diagnosis not present

## 2016-08-02 LAB — POCT INR: INR: 3.3

## 2016-09-01 ENCOUNTER — Other Ambulatory Visit: Payer: Self-pay | Admitting: Internal Medicine

## 2016-10-02 ENCOUNTER — Ambulatory Visit (INDEPENDENT_AMBULATORY_CARE_PROVIDER_SITE_OTHER): Payer: BLUE CROSS/BLUE SHIELD | Admitting: *Deleted

## 2016-10-02 ENCOUNTER — Encounter (INDEPENDENT_AMBULATORY_CARE_PROVIDER_SITE_OTHER): Payer: Self-pay

## 2016-10-02 DIAGNOSIS — I4892 Unspecified atrial flutter: Secondary | ICD-10-CM

## 2016-10-02 DIAGNOSIS — Z7901 Long term (current) use of anticoagulants: Secondary | ICD-10-CM

## 2016-10-02 LAB — POCT INR: INR: 4.4

## 2016-10-02 MED ORDER — WARFARIN SODIUM 5 MG PO TABS: ORAL_TABLET | ORAL | 0 refills | Status: DC

## 2016-10-16 ENCOUNTER — Ambulatory Visit (INDEPENDENT_AMBULATORY_CARE_PROVIDER_SITE_OTHER): Payer: BLUE CROSS/BLUE SHIELD | Admitting: *Deleted

## 2016-10-16 DIAGNOSIS — I4892 Unspecified atrial flutter: Secondary | ICD-10-CM | POA: Diagnosis not present

## 2016-10-16 DIAGNOSIS — Z7901 Long term (current) use of anticoagulants: Secondary | ICD-10-CM

## 2016-10-16 LAB — POCT INR: INR: 4

## 2016-11-01 ENCOUNTER — Ambulatory Visit (INDEPENDENT_AMBULATORY_CARE_PROVIDER_SITE_OTHER): Payer: BLUE CROSS/BLUE SHIELD | Admitting: *Deleted

## 2016-11-01 DIAGNOSIS — Z7901 Long term (current) use of anticoagulants: Secondary | ICD-10-CM

## 2016-11-01 DIAGNOSIS — I4892 Unspecified atrial flutter: Secondary | ICD-10-CM | POA: Diagnosis not present

## 2016-11-01 LAB — POCT INR: INR: 5.3

## 2016-11-03 ENCOUNTER — Other Ambulatory Visit: Payer: Self-pay | Admitting: Internal Medicine

## 2016-11-13 ENCOUNTER — Encounter (HOSPITAL_COMMUNITY): Payer: Self-pay

## 2016-11-13 ENCOUNTER — Observation Stay (HOSPITAL_COMMUNITY)
Admission: EM | Admit: 2016-11-13 | Discharge: 2016-11-14 | Disposition: A | Discharge status: Home / Self Care | Payer: BLUE CROSS/BLUE SHIELD | Attending: Internal Medicine | Admitting: Internal Medicine

## 2016-11-13 ENCOUNTER — Emergency Department (HOSPITAL_COMMUNITY): Payer: BLUE CROSS/BLUE SHIELD

## 2016-11-13 ENCOUNTER — Ambulatory Visit (HOSPITAL_COMMUNITY)
Admission: EM | Disposition: A | Discharge status: Home / Self Care | Payer: Self-pay | Source: Home / Self Care | Attending: Emergency Medicine

## 2016-11-13 DIAGNOSIS — Z7901 Long term (current) use of anticoagulants: Secondary | ICD-10-CM | POA: Diagnosis not present

## 2016-11-13 DIAGNOSIS — I483 Typical atrial flutter: Secondary | ICD-10-CM | POA: Diagnosis not present

## 2016-11-13 DIAGNOSIS — I429 Cardiomyopathy, unspecified: Secondary | ICD-10-CM | POA: Diagnosis not present

## 2016-11-13 DIAGNOSIS — Z7984 Long term (current) use of oral hypoglycemic drugs: Secondary | ICD-10-CM | POA: Diagnosis not present

## 2016-11-13 DIAGNOSIS — M109 Gout, unspecified: Secondary | ICD-10-CM | POA: Insufficient documentation

## 2016-11-13 DIAGNOSIS — Z79899 Other long term (current) drug therapy: Secondary | ICD-10-CM | POA: Diagnosis not present

## 2016-11-13 DIAGNOSIS — I4891 Unspecified atrial fibrillation: Secondary | ICD-10-CM | POA: Diagnosis not present

## 2016-11-13 DIAGNOSIS — I340 Nonrheumatic mitral (valve) insufficiency: Secondary | ICD-10-CM | POA: Diagnosis not present

## 2016-11-13 DIAGNOSIS — I110 Hypertensive heart disease with heart failure: Secondary | ICD-10-CM | POA: Diagnosis not present

## 2016-11-13 DIAGNOSIS — I5022 Chronic systolic (congestive) heart failure: Secondary | ICD-10-CM | POA: Insufficient documentation

## 2016-11-13 DIAGNOSIS — E119 Type 2 diabetes mellitus without complications: Secondary | ICD-10-CM | POA: Diagnosis not present

## 2016-11-13 DIAGNOSIS — I472 Ventricular tachycardia: Secondary | ICD-10-CM | POA: Insufficient documentation

## 2016-11-13 DIAGNOSIS — I4892 Unspecified atrial flutter: Secondary | ICD-10-CM | POA: Diagnosis present

## 2016-11-13 HISTORY — PX: A-FLUTTER ABLATION: EP1230

## 2016-11-13 LAB — COMPREHENSIVE METABOLIC PANEL
ALBUMIN: 3.4 g/dL — AB (ref 3.5–5.0)
ALK PHOS: 38 U/L (ref 38–126)
ALT: 34 U/L (ref 17–63)
ANION GAP: 11 (ref 5–15)
AST: 27 U/L (ref 15–41)
BUN: 11 mg/dL (ref 6–20)
CALCIUM: 8.8 mg/dL — AB (ref 8.9–10.3)
CO2: 30 mmol/L (ref 22–32)
Chloride: 95 mmol/L — ABNORMAL LOW (ref 101–111)
Creatinine, Ser: 1.2 mg/dL (ref 0.61–1.24)
GFR calc non Af Amer: >60 mL/min (ref >60)
GLUCOSE: 392 mg/dL — AB (ref 65–99)
POTASSIUM: 4 mmol/L (ref 3.5–5.1)
Sodium: 136 mmol/L (ref 135–145)
Total Bilirubin: 2.1 mg/dL — ABNORMAL HIGH (ref 0.3–1.2)
Total Protein: 6.2 g/dL — ABNORMAL LOW (ref 6.5–8.1)

## 2016-11-13 LAB — CBC WITH DIFFERENTIAL/PLATELET
BASOS ABS: 0 10*3/uL (ref 0.0–0.1)
BASOS PCT: 0 %
EOS ABS: 0 10*3/uL (ref 0.0–0.7)
EOS PCT: 0 %
HCT: 43.3 % (ref 39.0–52.0)
Hemoglobin: 15.7 g/dL (ref 13.0–17.0)
Lymphocytes Relative: 28 %
Lymphs Abs: 3.7 10*3/uL (ref 0.7–4.0)
MCH: 32.4 pg (ref 26.0–34.0)
MCHC: 36.3 g/dL — ABNORMAL HIGH (ref 30.0–36.0)
MCV: 89.3 fL (ref 78.0–100.0)
MONO ABS: 1 10*3/uL (ref 0.1–1.0)
Monocytes Relative: 7 %
Neutro Abs: 8.3 10*3/uL — ABNORMAL HIGH (ref 1.7–7.7)
Neutrophils Relative %: 65 %
PLATELETS: 211 10*3/uL (ref 150–400)
RBC: 4.85 MIL/uL (ref 4.22–5.81)
RDW: 13.9 % (ref 11.5–15.5)
WBC: 13 10*3/uL — AB (ref 4.0–10.5)

## 2016-11-13 LAB — I-STAT TROPONIN, ED: TROPONIN I, POC: 0 ng/mL (ref 0.00–0.08)

## 2016-11-13 LAB — PROTIME-INR
INR: 2.03
PROTHROMBIN TIME: 23.2 s — AB (ref 11.4–15.2)

## 2016-11-13 LAB — GLUCOSE, CAPILLARY
GLUCOSE-CAPILLARY: 303 mg/dL — AB (ref 65–99)
Glucose-Capillary: 308 mg/dL — ABNORMAL HIGH (ref 65–99)

## 2016-11-13 LAB — BRAIN NATRIURETIC PEPTIDE: B Natriuretic Peptide: 783.7 pg/mL — ABNORMAL HIGH (ref 0.0–100.0)

## 2016-11-13 LAB — MAGNESIUM: MAGNESIUM: 1.5 mg/dL — AB (ref 1.7–2.4)

## 2016-11-13 SURGERY — A-FLUTTER ABLATION

## 2016-11-13 MED ORDER — ACETAMINOPHEN 325 MG PO TABS: 650.0000 mg | ORAL_TABLET | ORAL | Status: DC | PRN

## 2016-11-13 MED ORDER — BUPIVACAINE HCL (PF) 0.25 % IJ SOLN
INTRAMUSCULAR | Status: AC
  Filled 2016-11-13: qty 60

## 2016-11-13 MED ORDER — FENTANYL CITRATE (PF) 100 MCG/2ML IJ SOLN
INTRAMUSCULAR | Status: AC
  Filled 2016-11-13: qty 2

## 2016-11-13 MED ORDER — LISINOPRIL 10 MG PO TABS
20.0000 mg | ORAL_TABLET | Freq: Every day | ORAL | Status: DC
  Administered 2016-11-14: 20 mg via ORAL
  Filled 2016-11-13: qty 2

## 2016-11-13 MED ORDER — CARVEDILOL 3.125 MG PO TABS
18.7500 mg | ORAL_TABLET | Freq: Two times a day (BID) | ORAL | Status: DC
  Administered 2016-11-14: 09:00:00 18.75 mg via ORAL
  Filled 2016-11-13: qty 2

## 2016-11-13 MED ORDER — ONDANSETRON HCL 4 MG/2ML IJ SOLN: 4.0000 mg | Freq: Four times a day (QID) | INTRAMUSCULAR | Status: DC | PRN

## 2016-11-13 MED ORDER — MIDAZOLAM HCL 5 MG/5ML IJ SOLN
INTRAMUSCULAR | Status: AC
  Filled 2016-11-13: qty 5

## 2016-11-13 MED ORDER — INSULIN ASPART 100 UNIT/ML ~~LOC~~ SOLN
0.0000 [IU] | Freq: Three times a day (TID) | SUBCUTANEOUS | Status: DC
  Administered 2016-11-14: 3 [IU] via SUBCUTANEOUS

## 2016-11-13 MED ORDER — FENTANYL CITRATE (PF) 100 MCG/2ML IJ SOLN
INTRAMUSCULAR | Status: DC | PRN
Start: 2016-11-13 — End: 2016-11-13
  Administered 2016-11-13 (×5): 12.5 ug via INTRAVENOUS
  Administered 2016-11-13: 25 ug via INTRAVENOUS
  Administered 2016-11-13: 12.5 ug via INTRAVENOUS

## 2016-11-13 MED ORDER — MAGNESIUM SULFATE 2 GM/50ML IV SOLN
2.0000 g | Freq: Once | INTRAVENOUS | Status: AC
  Administered 2016-11-13: 2 g via INTRAVENOUS
  Filled 2016-11-13: qty 50

## 2016-11-13 MED ORDER — SODIUM CHLORIDE 0.9 % IV SOLN: 250.0000 mL | INTRAVENOUS | Status: DC | PRN

## 2016-11-13 MED ORDER — FUROSEMIDE 40 MG PO TABS
40.0000 mg | ORAL_TABLET | Freq: Two times a day (BID) | ORAL | Status: DC
  Administered 2016-11-14: 40 mg via ORAL
  Filled 2016-11-13: qty 1

## 2016-11-13 MED ORDER — ATORVASTATIN CALCIUM 20 MG PO TABS
20.0000 mg | ORAL_TABLET | Freq: Every day | ORAL | Status: DC
  Administered 2016-11-14: 20 mg via ORAL
  Filled 2016-11-13: qty 1

## 2016-11-13 MED ORDER — ALLOPURINOL 300 MG PO TABS
300.0000 mg | ORAL_TABLET | Freq: Every day | ORAL | Status: DC
  Administered 2016-11-14: 300 mg via ORAL
  Filled 2016-11-13: qty 1

## 2016-11-13 MED ORDER — DILTIAZEM HCL 25 MG/5ML IV SOLN
20.0000 mg | Freq: Once | INTRAVENOUS | Status: AC
  Administered 2016-11-13: 20 mg via INTRAVENOUS
  Filled 2016-11-13: qty 5

## 2016-11-13 MED ORDER — SODIUM CHLORIDE 0.9% FLUSH: 3.0000 mL | Freq: Two times a day (BID) | INTRAVENOUS | Status: DC

## 2016-11-13 MED ORDER — OFF THE BEAT BOOK
Freq: Once | Status: DC
  Filled 2016-11-13: qty 1

## 2016-11-13 MED ORDER — INSULIN ASPART 100 UNIT/ML ~~LOC~~ SOLN
15.0000 [IU] | Freq: Once | SUBCUTANEOUS | Status: AC
  Administered 2016-11-13: 15 [IU] via SUBCUTANEOUS

## 2016-11-13 MED ORDER — FLUTICASONE PROPIONATE 50 MCG/ACT NA SUSP
1.0000 | Freq: Two times a day (BID) | NASAL | Status: DC
  Administered 2016-11-13 – 2016-11-14 (×2): 1 via NASAL
  Filled 2016-11-13 (×2): qty 16

## 2016-11-13 MED ORDER — MIDAZOLAM HCL 5 MG/5ML IJ SOLN
INTRAMUSCULAR | Status: DC | PRN
  Administered 2016-11-13 (×8): 1 mg via INTRAVENOUS

## 2016-11-13 MED ORDER — LORATADINE 10 MG PO TABS
10.0000 mg | ORAL_TABLET | Freq: Every day | ORAL | Status: DC
  Administered 2016-11-14: 09:00:00 10 mg via ORAL
  Filled 2016-11-13: qty 1

## 2016-11-13 MED ORDER — BUPIVACAINE HCL (PF) 0.25 % IJ SOLN
INTRAMUSCULAR | Status: DC | PRN
  Administered 2016-11-13: 45 mL

## 2016-11-13 MED ORDER — HEPARIN (PORCINE) IN NACL 2-0.9 UNIT/ML-% IJ SOLN
INTRAMUSCULAR | Status: DC | PRN
  Administered 2016-11-13: 1000 mL

## 2016-11-13 MED ORDER — INSULIN ASPART 100 UNIT/ML ~~LOC~~ SOLN
0.0000 [IU] | Freq: Three times a day (TID) | SUBCUTANEOUS | Status: DC
  Administered 2016-11-13: 11 [IU] via SUBCUTANEOUS

## 2016-11-13 MED ORDER — SODIUM CHLORIDE 0.9% FLUSH: 3.0000 mL | INTRAVENOUS | Status: DC | PRN

## 2016-11-13 SURGICAL SUPPLY — 11 items
BAG SNAP BAND KOVER 36X36 (MISCELLANEOUS) ×2 IMPLANT
CATH BLAZERPRIME XP (ABLATOR) ×2 IMPLANT
CATH JOSEPHSON QUAD-ALLRED 6FR (CATHETERS) ×2 IMPLANT
CATH POLARIS X 2.5/5/2.5 DECAP (CATHETERS) ×2 IMPLANT
PACK EP LATEX FREE (CUSTOM PROCEDURE TRAY) ×1
PACK EP LF (CUSTOM PROCEDURE TRAY) ×1 IMPLANT
PAD DEFIB LIFELINK (PAD) ×2 IMPLANT
SHEATH PINNACLE 6F 10CM (SHEATH) ×2 IMPLANT
SHEATH PINNACLE 7F 10CM (SHEATH) ×2 IMPLANT
SHEATH PINNACLE 8F 10CM (SHEATH) ×2 IMPLANT
SHIELD RADPAD SCOOP 12X17 (MISCELLANEOUS) ×2 IMPLANT

## 2016-11-13 NOTE — ED Notes (Signed)
Patient transported to X-ray 

## 2016-11-13 NOTE — H&P (Signed)
ELECTROPHYSIOLOGY CONSULT NOTE    Patient ID: John Pham MRN: [id number], DOB/AGE: [birth year] 56 y.o.  Admit date: 11/13/2016 Date of Consult: 11/13/2016   Primary Physician: Redmond Baseman, MD Primary Cardiologist: D. Crenshaw (last in 2014) Electrophysiologist: Dr. Ladona Ridgel (last 2014)  Reason for Visit: tachycardia  HPI: John Pham is a 56 y.o. male with PMHx of documented SVT who underwent catheter ablation of both atrial tachycardia as well as AV node reentrant tachycardia in may of 2012, and return of AFlutter with DCCV in 2014. last seen by Dr. Ladona Ridgel 2014 shortly after his DCCV was back in flutter and pending repeat DCCV and an ablation once insurance issues were resolved, has not been back to the clinic since that time.  He also has HTN, DM, gout and hx of CM/CHF and mod-severe MR by his last echo.  In the last week he had noted mild though persistent feet/ankle swelling, he was planned to see his PMD today to discuss his metformin dose, given since last up-titration has had GI intolerance.  He was noted by his PMD to be somewhat tachycardic and sent to the ER.  The patient has not overt sensation of palpitations, no SOB, no CP.  He has not had near syncope or syncope.  On arrival to the ER he was noted to be in Aflutter 2:1, V rates 110-115.  The patient denies any recent fever, illness, no N/V/D.  LABS: K+ 4.0 BUN/Creat 11/1.20 Mag 1.5 (replaced in ER) poc Trop 0.00 BNP 783 WBC 13.0 H/H 15/43 plts 211 INR 2.03 (note his INRs 2/29/18 was 4.4, 10/16/16 was 4.0, 11/01/16 was 5.3), monitored/managed with the coumadin clinic  Glucose 392   Past Medical History:  Diagnosis Date  . Atrial flutter (HCC)   . Cardiomyopathy (HCC)   . CHF (congestive heart failure) (HCC)   . Heart murmur   . HTN (hypertension)   . Seasonal allergies   . SVT (supraventricular tachycardia) [facility])      Surgical History:  Past Surgical History:  Procedure Laterality Date  .  CARDIOVERSION N/A 05/07/2013   Procedure: CARDIOVERSION;  Surgeon: Wendall Stade, MD;  [facility] ENDOSCOPY;  Service: Cardiovascular;  Laterality: N/A;  . SYNOVECTOMY        (Not in a hospital admission)  Inpatient Medications:   Allergies: No Known Allergies  Social History   Social History  . Marital status: Single    Spouse name: N/A  . Number of children: N/A  . Years of education: N/A   Occupational History  . Not on file.   Social History Main Topics  . Smoking status: Never Smoker  . Smokeless tobacco: Never Used  . Alcohol use Yes     Comment: occasionally  . Drug use: No  . Sexual activity: Not on file   Other Topics Concern  . Not on file   Social History Narrative  . No narrative on file     Family History  Problem Relation Age of Onset  . Hypertension Mother      Review of Systems: All other systems reviewed and are otherwise negative except as noted above.  Physical Exam: Vitals:   11/13/16 1100 11/13/16 1115 11/13/16 1130 11/13/16 1145  BP: 111/83 108/87 112/87 115/82  Pulse: (!) 101 (!) 106 (!) 109 (!) 101  Resp: Temp:      TempSrc:      SpO2: 97% 96% 95% 95%  Weight:  Height:        GEN- The patient is well appearing, alert and oriented x 3 today.   HEENT: normocephalic, atraumatic; sclera clear, conjunctiva pink; hearing intact; oropharynx clear; neck supple, no JVP Lymph- no cervical lymphadenopathy Lungs- CTA b/l , normal work of breathing.  No wheezes, rales, rhonchi Heart- RRR, tachycardic, 2/6 SM, no rubs or gallops, PMI not laterally displaced GI- soft, non-tender, non-distended Extremities- no clubbing, cyanosis, trace edema MS- no significant deformity or atrophy Skin- warm and dry, no rash or lesion Psych- euthymic mood, full affect Neuro- no gross deficits observed  Labs:   Lab Results  Component Value Date   WBC 13.0 (H) 11/13/2016   HGB 15.7 11/13/2016   HCT 43.3 11/13/2016   MCV 89.3  11/13/2016   PLT 211 11/13/2016     Recent Labs Lab 11/13/16 0952  NA 136  K 4.0  CL 95*  CO2 30  BUN 11  CREATININE 1.20  CALCIUM 8.8*  PROT 6.2*  BILITOT 2.1*  ALKPHOS 38  ALT 34  AST 27  GLUCOSE 392*      Radiology/Studies:  Dg Chest 2 View Result Date: 11/13/2016 CLINICAL DATA:  Lower extremity swelling and abdominal distension EXAM: CHEST  2 VIEW COMPARISON:  01/02/2011 FINDINGS: There is no focal parenchymal opacity. There is no pleural effusion or pneumothorax. There is stable cardiomegaly. The osseous structures are unremarkable. IMPRESSION: No active cardiopulmonary disease. Electronically Signed   By: Elige Ko   On: 11/13/2016 10:29    Reviewed by myself: EKG:2:1 AFlutter, appears typical TELEMETRY: AFlutter 100-115  04/11/13: TTE Study Conclusions - Left ventricle: The cavity size was normal. Wall thickness was increased in a pattern of mild LVH. Systolic function was severely reduced. The estimated ejection fraction was in the range of 25% to 30% with diffuse hypokinesis. Left ventricular diastolic function parameters were normal. - Right ventricle: Systolic function was mildly reduced. - Pulmonary arteries: PA peak pressure: 48mm Hg (S).    Assessment and Plan:   1. Recurrent AFlutter     CHA2DS2Vasc is 3, on warfarin     He is chronically on warfarin, his INR is therapeutic, and of late have been toxic     Risks/benefits of EPS/Ablation were discussed with the patient and his wife (a FP MD) and they would like to proceed     Will plan for this afternoon  2. Hx of NICM and Mod-severe MR by last echo     Will update echo     Will continue home lasix, anticipate improvement with regain of SR  3. HTN     No changes  4. DM     ooc,  he has not taken his metformin for a week due to GI intolerance     Sliding scale ordered  Signed, Francis Dowse, PA-C 11/13/2016 12:22 PM  EP Attending  Patient seen and examined. Agree with above. The  patient presents with typical symptomatic atrial flutter with a RVR. He has a Reg tachy, clear lungs, trace edema and normal neuro exam. His ECG demonstrated typical atrial flutter. I have discussed the risks/benefits/goals/expectations of catheter ablation of atrial flutter and he is willing to proceed.   Leonia Reeves.D.

## 2016-11-13 NOTE — ED Provider Notes (Signed)
[facility] DEPT Provider Note   CSN: [id number] Arrival date & time: 11/13/16  4098     History   Chief Complaint Chief Complaint  Patient presents with  . Atrial Fibrillation    HPI John Pham is a 56 y.o. male.  HPI  56 year old male with a history of atrial flutter, cardiomyopathy with CHF, type 2 diabetes, and hypertension presents from his doctor's office with atrial flutter with rapid ventricular rate. Patient states that for the last 1 week or so he's had right greater than left pedal and lower leg edema. He feels like the swelling has actually improved over the last 24 hours. There has been no leg pain. No recent travel. He went to his PCP for a routine 90 day visit and because of the increased heart rate was sent to the ER. He states his PCP called urology, Dr. Ladona Ridgel, and sent him to the ER. The patient does not know if he is currently in atrial flutter. He has not been feeling palpitations, lightheadedness, or dizziness. No chest pain or shortness of breath. No orthopnea. He has been having on and off abdominal pain and burning intermittently for the last 90 days. He thinks this is associated with an increased dose of metformin that started 90 days ago. Currently no abdominal pain. Last EF in 2014 was 25%. Has been ablated twice.  Past Medical History:  Diagnosis Date  . Atrial flutter (HCC)   . Cardiomyopathy (HCC)   . CHF (congestive heart failure) (HCC)   . Heart murmur   . HTN (hypertension)   . Seasonal allergies   . SVT (supraventricular tachycardia) [facility])     Patient Active Problem List   Diagnosis Date Noted  . Congestive dilated cardiomyopathy (HCC) 05/05/2013  . Mitral regurgitation 05/05/2013  . Type II or unspecified type diabetes mellitus without mention of complication, not stated as uncontrolled 07/13/2012  . Long term (current) use of anticoagulants 03/17/2011  . Atrial flutter (HCC) 03/16/2011  . Gout 03/16/2011  . Chronic systolic heart  failure (HCC) 11/91/4782  . Atrial tachycardia (HCC) 01/12/2011  . Edema 12/20/2010  . Hypertension 12/20/2010    Past Surgical History:  Procedure Laterality Date  . CARDIOVERSION N/A 05/07/2013   Procedure: CARDIOVERSION;  Surgeon: Wendall Stade, MD;  Location: [facility] ENDOSCOPY;  Service: Cardiovascular;  Laterality: N/A;  . SYNOVECTOMY         Home Medications    Prior to Admission medications   Medication Sig Start Date End Date Taking? Authorizing Provider  allopurinol (ZYLOPRIM) 300 MG tablet Take 1 tablet (300 mg total) by mouth daily. Patient taking differently: Take 300 mg by mouth 2 (two) times daily.  12/08/13   Sheryl L Mindi Junker, DO  atorvastatin (LIPITOR) 20 MG tablet Take 20 mg by mouth daily.    Historical Provider, MD  carvedilol (COREG) 12.5 MG tablet Take 1 1/2 tablets twice daily 12/08/13   Sherlyn Hay, DO  cetirizine (ZYRTEC) 10 MG tablet Take 10 mg by mouth daily.      Historical Provider, MD  colchicine (COLCRYS) 0.6 MG tablet Take 0.6 mg by mouth 2 (two) times daily.    Historical Provider, MD  furosemide (LASIX) 40 MG tablet Take 1 tablet (40 mg total) by mouth 2 (two) times daily. 12/08/13   Sheryl L Mindi Junker, DO  lisinopril (PRINIVIL,ZESTRIL) 20 MG tablet Take 1 tablet (20 mg total) by mouth daily.    Godfrey Pick, PA-C  metFORMIN (GLUCOPHAGE) 500 MG tablet Take 500  mg by mouth 2 (two) times daily with a meal.    Historical Provider, MD  warfarin (COUMADIN) 5 MG tablet TAKE AS DIRECTED BY COUMADIN CLINIC. NEED APPOINTMENT FOR REFILLS. 11/03/16   Dolores Patty, MD    Family History No family history on file.  Social History Social History  Substance Use Topics  . Smoking status: Never Smoker  . Smokeless tobacco: Never Used  . Alcohol use Yes     Comment: occasionally     Allergies   Patient has no known allergies.   Review of Systems Review of Systems  Respiratory: Positive for cough (x 6 months). Negative for shortness of breath.     Cardiovascular: Positive for leg swelling. Negative for chest pain and palpitations.  Gastrointestinal: Positive for abdominal pain. Negative for vomiting.  All other systems reviewed and are negative.    Physical Exam Updated Vital Signs BP 114/87 (BP Location: Right Arm)   Pulse (!) 110   Temp 98.6 F (37 C) (Oral)   Resp 13   Ht  (1.854 m)   Wt 215 lb (97.5 kg)   SpO2 98%   BMI 28.37 kg/m   Physical Exam  Constitutional: He is oriented to person, place, and time. He appears well-developed and well-nourished. No distress.  HENT:  Head: Normocephalic and atraumatic.  Right Ear: External ear normal.  Left Ear: External ear normal.  Nose: Nose normal.  Eyes: Right eye exhibits no discharge. Left eye exhibits no discharge.  Neck: Neck supple.  Cardiovascular: Regular rhythm and normal heart sounds.  Tachycardia present.   Pulses:      Radial pulses are 2+ on the right side, and 2+ on the left side.  Pulmonary/Chest: Effort normal and breath sounds normal.  Abdominal: Soft. He exhibits no distension. There is no tenderness.  Musculoskeletal: He exhibits edema (pitting edema, RLE mildly worse than left up to mid lower leg).  Neurological: He is alert and oriented to person, place, and time.  Skin: Skin is warm and dry. He is not diaphoretic.  Nursing note and vitals reviewed.    ED Treatments / Results  Labs (all labs ordered are listed, but only abnormal results are displayed) Labs Reviewed  CBC WITH DIFFERENTIAL/PLATELET - Abnormal; Notable for the following:       Result Value   WBC 13.0 (*)    MCHC 36.3 (*)    Neutro Abs 8.3 (*)    All other components within normal limits  COMPREHENSIVE METABOLIC PANEL - Abnormal; Notable for the following:    Chloride 95 (*)    Glucose, Bld 392 (*)    Calcium 8.8 (*)    Total Protein 6.2 (*)    Albumin 3.4 (*)    Total Bilirubin 2.1 (*)    All other components within normal limits  BRAIN NATRIURETIC PEPTIDE -  Abnormal; Notable for the following:    B Natriuretic Peptide 783.7 (*)    All other components within normal limits  PROTIME-INR - Abnormal; Notable for the following:    Prothrombin Time 23.2 (*)    All other components within normal limits  MAGNESIUM - Abnormal; Notable for the following:    Magnesium 1.5 (*)    All other components within normal limits  I-STAT TROPOININ, ED    EKG  EKG Interpretation  Date/Time:  Monday November 13 2016 09:45:16 EDT Ventricular Rate:  120 PR Interval:    QRS Duration: 103 QT Interval:  363 QTC Calculation: 513 R Axis:   -  4 Text Interpretation:  Ectopic atrial tachycardia, unifocal Anterior infarct, old Repol abnrm suggests ischemia, inferior leads Prolonged QT interval aflutter? rate is faster, otherwise similar to sept 2014 Confirmed by Criss Alvine MD, Rana Adorno [phone]) on 11/13/2016 9:55:35 AM Also confirmed by Criss Alvine MD, Kwali Wrinkle [phone]), editor Misty Stanley [phone])  on 11/13/2016 10:05:04 AM       Radiology Dg Chest 2 View  Result Date: 11/13/2016 CLINICAL DATA:  Lower extremity swelling and abdominal distension EXAM: CHEST  2 VIEW COMPARISON:  01/02/2011 FINDINGS: There is no focal parenchymal opacity. There is no pleural effusion or pneumothorax. There is stable cardiomegaly. The osseous structures are unremarkable. IMPRESSION: No active cardiopulmonary disease. Electronically Signed   By: Elige Ko   On: 11/13/2016 10:29    Procedures Procedures (including critical care time)  Medications Ordered in ED Medications  diltiazem (CARDIZEM) injection 20 mg (not administered)     Initial Impression / Assessment and Plan / ED Course  I have reviewed the triage vital signs and the nursing notes.  Pertinent labs & imaging results that were available during my care of the patient were reviewed by me and considered in my medical decision making (see chart for details).  Clinical Course as of Nov 13 1848  Mon Nov 13, 2016  1015 Given the  leg swelling with possible acute or subacute heart failure, will do cardizem rather than metoprolol for acute HR control. No distress. Labs, CXR, and consult cardiology  [SG]  1106 Patient remains stable. Cards consult given he was sent per Dr. Ladona Ridgel to ED. Likely has some degree of acute CHF given BNP of 780. HR improved a little with cardizem.  [SG]    Clinical Course User Index [SG] Pricilla Loveless, MD    Cards to admit for repeat ablation. Stable at this time.   Final Clinical Impressions(s) / ED Diagnoses   Final diagnoses:  Atrial flutter with rapid ventricular response [facility])    New Prescriptions New Prescriptions   No medications on file     Pricilla Loveless, MD 11/13/16 1851

## 2016-11-13 NOTE — Progress Notes (Signed)
ANTICOAGULATION CONSULT NOTE  Pharmacy Consult for warfarin Indication: atrial fibrillation   Assessment: 56 yom with hx of afib on warfarin PTA. Pharmacy consulted to dose while inpatient. INR 2.03 on admit. CBC wnl. No bleed documented.  PTA warfarin dose:  daily except  on Mon (last dose 4/2 PTA)  Goal of Therapy:  INR 2-3 Monitor platelets by anticoagulation protocol: Yes   Plan:  No warfarin tonight (took dose today already) Daily INR Monitor CBC, s/sx bleeding   Babs Bertin, PharmD, BCPS Clinical Pharmacist 11/13/2016 1:07 PM

## 2016-11-13 NOTE — Progress Notes (Signed)
Patients 6, 7, and 8 Fr right femoral veinous sheaths were removed @ 1720. Manual pressure was held for over 15 minutes. Sheath removal site remained a level: 0. Patient has bilateral DP pulses that are +2. Patients VS were WNL's during sheath pull and patient had no complaints. Sheath removal site was covered with a gauze and Tegaderm dressing that is clean, dry, and intact. There are no signs of a hematoma or bleeding at sheath removal site. Patients bedrest began at 1735 and ends in six hours at 2135. Patient was educated on bed rest and post sheath pull restrictions. Will continue to monitor patient at this time.

## 2016-11-13 NOTE — Discharge Summary (Signed)
ELECTROPHYSIOLOGY PROCEDURE DISCHARGE SUMMARY    Patient ID: John Pham,  MRN: [id number], DOB/AGE: 11-28-1960 56 y.o.  Admit date: 11/13/2016 Discharge date: 11/14/16  Primary Care Physician: Redmond Baseman, MD  Primary Cardiologist: Dr. Jens Som (last 2014) Electrophysiologist: Dr. Ladona Ridgel  Primary Discharge Diagnosis:  1. Aflutter  Secondary Discharge Diagnosis:  1. NICM, hx of CHF 2. HTN 3. DM   No Known Allergies   Procedures This Admission: 1.  Electrophysiology study and radiofrequency catheter ablation on 11/13/16 by Dr Ladona Ridgel.  This study demonstrated: CONCLUSIONS:  1. Isthmus-dependent right atrial flutter upon presentation.  2. Successful radiofrequency ablation of atrial flutter along the cavotricuspid isthmus with complete bidirectional isthmus block achieved.  3. No inducible arrhythmias following ablation.  4. No early apparent complications.  There were no inducible arrhythmias following ablation and no early apparent complications.   Brief HPI: John Pham is a 57 y.o. male with a past medical history documented SVT who underwent catheter ablation of both atrial tachycardia as well as AV node reentrant tachycardia in may of 2012, and return of AFlutter with DCCV in 2014. last seen by Dr. Ladona Ridgel 2014 shortly after his DCCV was back in flutter and pending repeat DCCV and an ablation once insurance issues were resolved, has not been back to the clinic since that time.  He also has HTN, DM, gout and hx of CM/CHF and mod-severe MR by his last echo.    Admitted to [facility] with persistent pedal edema, his PMD noted him tachycardic and referred him to the ER where he was noted in Aflutter, he was seen in the ER by dr. Ladona Ridgel and planned to undergo ablation given his INR's have consistently bee either therapeutic or supratherapeurtic in the last couple months.   Hospital Course:  The patient was admitted and underwent EPS/RFCA with details as outlined  above. was monitored on telemetry overnight which demonstrated SR/ST, NSVT .  Right groin site is without complication.  He was examined by Dr. Ladona Ridgel and considered stable for discharge to home.  The patient was advised to resume his prescribed DM regime and call his PMD to discuss his metformin, he will keep his coumadin clinic visit tomorrow, he will have his echo done out patient to allow a couple weeks of SR to re-evaluate his LVEF as well as his MR Follow up will be arranged in 4 weeks, plans to maintain full a/c over the next few months and consider out patient monitoring to evaluate for any AFib given his CM and history.  Wound care and restrictions were reviewed with the patient prior to discharge.   Physical Exam: Vitals:   11/14/16 0210 11/14/16 0410 11/14/16 0645 11/14/16 0700  BP: 105/68 107/76 117/87 117/87  Pulse: 100 (!) 101 98 (!) 102  Resp:  (!) 21 (!) 21 (!) 23  Temp:    98 F (36.7 C)  TempSrc:    Oral  SpO2:  97% 97% 93%  Weight:      Height:        GEN- The patient is well appearing, alert and oriented x 3 today.   HEENT: normocephalic, atraumatic; sclera clear, conjunctiva pink; hearing intact; oropharynx clear; neck supple, no JVP Lymph- no cervical lymphadenopathy Lungs- CTA b/l, normal work of breathing.  No wheezes, rales, rhonchi Heart- RRR, 2/6 SM, rubs or gallops, PMI not laterally displaced GI- soft, non-tender, non-distended Extremities- no clubbing, cyanosis, or edema; DP/PT/radial pulses 2+ bilaterally, groin without hematoma/bruit MS- no significant deformity  or atrophy Skin- warm and dry, no rash or lesion Psych- euthymic mood, full affect Neuro- strength and sensation are intact   Discharge Vitals: BP 117/87   Pulse (!) 102   Temp 98 F (36.7 C) (Oral)   Resp (!) 23   Ht  (1.854 m)   Wt 215 lb (97.5 kg)   SpO2 93%   BMI 28.37 kg/m    Labs:   Lab Results  Component Value Date   WBC 13.4 (H) 11/14/2016   HGB 14.2 11/14/2016   HCT  38.8 (L) 11/14/2016   MCV 89.6 11/14/2016   PLT 213 11/14/2016     Recent Labs Lab 11/13/16 0952 11/14/16 0440  NA 136 139  K 4.0 3.4*  CL 95* 98*  CO2 30 29  BUN 11 12  CREATININE 1.20 0.97  CALCIUM 8.8* 8.6*  PROT 6.2*  --   BILITOT 2.1*  --   ALKPHOS 38  --   ALT 34  --   AST 27  --   GLUCOSE 392* 115*    Discharge Medications:  Allergies as of 11/14/2016   No Known Allergies     Medication List    TAKE these medications   allopurinol 300 MG tablet Commonly known as:  ZYLOPRIM Take 1 tablet (300 mg total) by mouth daily.   atorvastatin 20 MG tablet Commonly known as:  LIPITOR Take 20 mg by mouth daily.   carvedilol 12.5 MG tablet Commonly known as:  COREG Take 1 1/2 tablets twice daily What changed:  how much to take  how to take this  when to take this  additional instructions   cetirizine 10 MG tablet Commonly known as:  ZYRTEC Take 10 mg by mouth daily.   COLCRYS 0.6 MG tablet Generic drug:  colchicine Take 0.6 mg by mouth daily as needed (gout flareup).   fluticasone 50 MCG/ACT nasal spray Commonly known as:  FLONASE Place 1 spray into both nostrils 2 (two) times daily.   furosemide 40 MG tablet Commonly known as:  LASIX Take 1 tablet (40 mg total) by mouth 2 (two) times daily.   Ginseng 100 MG Caps Take 100 mg by mouth daily.   glucosamine-chondroitin 500-400 MG tablet Take 1 tablet by mouth daily.   lisinopril 20 MG tablet Commonly known as:  PRINIVIL,ZESTRIL Take 1 tablet (20 mg total) by mouth daily.   metFORMIN 500 MG tablet Commonly known as:  GLUCOPHAGE Take 1,000 mg by mouth 2 (two) times daily with a meal.   multivitamin with minerals Tabs tablet Take 1 tablet by mouth daily.   TART CHERRY ADVANCED PO Take 1 oz by mouth daily. Cherry tart juice   warfarin 5 MG tablet Commonly known as:  COUMADIN TAKE AS DIRECTED BY COUMADIN CLINIC. NEED APPOINTMENT FOR REFILLS. Notes to patient:  Continue your current home  regime today without interruption, please keep your coumadin clinic appointment as previously scheduled for tomorrow       Disposition:  Home  Discharge Instructions    Diet - low sodium heart healthy    Complete by:  As directed    Increase activity slowly    Complete by:  As directed      Follow-up Information    [facility] Office Follow up.   Specialty:  Cardiology Why:  11/15/16 @ 9:00AM, coumadin clinic 11/28/16 @ 8:30AM, echocardiogram (ultrasound) Contact information: [address], [address] [place] [address] [phone]  Lewayne Bunting, MD Follow up on 12/04/2016.   Specialty:  Cardiology Why:  9:00AM Contact information: [address] [place] [address] [phone]           Duration of Discharge Encounter: Greater than 30 minutes including physician time.  Norma Fredrickson, PA-C 11/14/2016 8:41 AM  EP Attending  Patient seen and examined. Agree with above. The patient is stable for DC. He is maintaining NSR after atrial flutter ablation. He had brief (seconds ) of PAF and VT at 150/min. I have discussed the treatment options with the patient. I suspect he has significant MR. He will need a 2D echo in followup. He is encouraged to maintain a low sodium diet. He will continue his ACE inhibitor and beta blocker. If his echo EF is down, then we will schedule the patient for a TEE (to eval his MR better) and consider ICD insertion in the coming months.   Leonia Reeves.D.

## 2016-11-13 NOTE — ED Triage Notes (Signed)
Pt brought in by EMS from doctors office for afib. Pt has c/o LE swelling and ABD distension. Pt denies chest pain or SOB. Pt a&ox4.

## 2016-11-14 ENCOUNTER — Encounter (HOSPITAL_COMMUNITY): Payer: Self-pay | Admitting: Internal Medicine

## 2016-11-14 ENCOUNTER — Other Ambulatory Visit: Payer: Self-pay | Admitting: Physician Assistant

## 2016-11-14 DIAGNOSIS — I340 Nonrheumatic mitral (valve) insufficiency: Secondary | ICD-10-CM

## 2016-11-14 DIAGNOSIS — I420 Dilated cardiomyopathy: Secondary | ICD-10-CM

## 2016-11-14 DIAGNOSIS — I110 Hypertensive heart disease with heart failure: Secondary | ICD-10-CM | POA: Diagnosis not present

## 2016-11-14 DIAGNOSIS — I483 Typical atrial flutter: Secondary | ICD-10-CM | POA: Diagnosis not present

## 2016-11-14 DIAGNOSIS — E119 Type 2 diabetes mellitus without complications: Secondary | ICD-10-CM | POA: Diagnosis not present

## 2016-11-14 DIAGNOSIS — M109 Gout, unspecified: Secondary | ICD-10-CM | POA: Diagnosis not present

## 2016-11-14 LAB — CBC
HCT: 38.8 % — ABNORMAL LOW (ref 39.0–52.0)
HEMOGLOBIN: 14.2 g/dL (ref 13.0–17.0)
MCH: 32.8 pg (ref 26.0–34.0)
MCHC: 36.6 g/dL — ABNORMAL HIGH (ref 30.0–36.0)
MCV: 89.6 fL (ref 78.0–100.0)
Platelets: 213 10*3/uL (ref 150–400)
RBC: 4.33 MIL/uL (ref 4.22–5.81)
RDW: 13.6 % (ref 11.5–15.5)
WBC: 13.4 10*3/uL — ABNORMAL HIGH (ref 4.0–10.5)

## 2016-11-14 LAB — BASIC METABOLIC PANEL
Anion gap: 12 (ref 5–15)
BUN: 12 mg/dL (ref 6–20)
CHLORIDE: 98 mmol/L — AB (ref 101–111)
CO2: 29 mmol/L (ref 22–32)
Calcium: 8.6 mg/dL — ABNORMAL LOW (ref 8.9–10.3)
Creatinine, Ser: 0.97 mg/dL (ref 0.61–1.24)
GFR calc non Af Amer: >60 mL/min (ref >60)
Glucose, Bld: 115 mg/dL — ABNORMAL HIGH (ref 65–99)
POTASSIUM: 3.4 mmol/L — AB (ref 3.5–5.1)
SODIUM: 139 mmol/L (ref 135–145)

## 2016-11-14 LAB — PHOSPHORUS: PHOSPHORUS: 4.4 mg/dL (ref 2.5–4.6)

## 2016-11-14 LAB — GLUCOSE, CAPILLARY
GLUCOSE-CAPILLARY: 136 mg/dL — AB (ref 65–99)
GLUCOSE-CAPILLARY: 123 mg/dL — AB (ref 65–99)

## 2016-11-14 LAB — MAGNESIUM: MAGNESIUM: 1.9 mg/dL (ref 1.7–2.4)

## 2016-11-14 MED FILL — Fentanyl Citrate Preservative Free (PF) Inj 100 MCG/2ML: INTRAMUSCULAR | Qty: 2 | Status: AC

## 2016-11-14 NOTE — Progress Notes (Signed)
Dr.Harris made aware of 6 beat run of VT. Pt asymptomatic.Will continue to monitor.

## 2016-11-14 NOTE — Progress Notes (Signed)
Notified of 31 beat of VT.  Monitor strips reviewed and is consistent with VT.  He had two other episodes of VT which were only 4-5 beats, but were of a different morphology than this last one.  Is s/p flutter ablation.  Will reserve antiarrythmic administration to EP once they see him in the morning.  Link Snuffer, MD, PhD Cardiology

## 2016-11-14 NOTE — Progress Notes (Signed)
Dr. Tiburcio Pea made aware of HS CBG 303 and dinner CBG 308 in which he received 11 Units Novolog Insulin.Informed that pt has not been taking his Metformin. New orders received for HS coverage and a more aggressive SS.

## 2016-11-14 NOTE — Discharge Instructions (Signed)
No driving for 4 days. No lifting over 5 lbs for 1 week. No vigorous or sexual activity for 1 week. You may return to work on 11/16/16, with activity restrictions as discussed/reviewed. Keep procedure site clean & dry. If you notice increased pain, swelling, bleeding or pus, call/return!  You may shower, but no soaking baths/hot tubs/pools for 1 week.   Please keep your coumadin clinic visit tomorrow 11/15/16 at 9:00AM

## 2016-11-14 NOTE — Care Management Note (Signed)
Case Management Note  Patient Details  Name: John Pham MRN: [id number] Date of Birth: [birth year]  Subjective/Objective:  s/p aflutter ablation, NCM will cont to follow for dc needs.                  Action/Plan:   Expected Discharge Date:  11/14/16               Expected Discharge Plan:  Home/Self Care  In-House Referral:     Discharge planning Services  CM Consult  Post Acute Care Choice:    Choice offered to:     DME Arranged:    DME Agency:     HH Arranged:    HH Agency:     Status of Service:  Completed, signed off  If discussed at [employer] of Stay Meetings, dates discussed:    Additional Comments:  Leone Haven, RN 11/14/2016, 9:03 AM

## 2016-11-14 NOTE — Progress Notes (Signed)
31 beat run v.tach; patient asymptomatic; Dr. Tiburcio Pea called- no new orders at this time. Will continue to monitor patient and report critical findings. EKG done.

## 2016-11-15 ENCOUNTER — Ambulatory Visit (INDEPENDENT_AMBULATORY_CARE_PROVIDER_SITE_OTHER): Payer: BLUE CROSS/BLUE SHIELD | Admitting: *Deleted

## 2016-11-15 DIAGNOSIS — Z7901 Long term (current) use of anticoagulants: Secondary | ICD-10-CM | POA: Diagnosis not present

## 2016-11-15 DIAGNOSIS — I4892 Unspecified atrial flutter: Secondary | ICD-10-CM

## 2016-11-15 LAB — POCT INR: INR: 1.9

## 2016-11-16 ENCOUNTER — Encounter: Payer: Self-pay | Admitting: Internal Medicine

## 2016-11-28 ENCOUNTER — Other Ambulatory Visit: Payer: Self-pay

## 2016-11-28 ENCOUNTER — Ambulatory Visit (HOSPITAL_COMMUNITY): Payer: BLUE CROSS/BLUE SHIELD | Attending: Cardiovascular Disease

## 2016-11-28 ENCOUNTER — Ambulatory Visit (INDEPENDENT_AMBULATORY_CARE_PROVIDER_SITE_OTHER): Payer: BLUE CROSS/BLUE SHIELD | Admitting: *Deleted

## 2016-11-28 DIAGNOSIS — E119 Type 2 diabetes mellitus without complications: Secondary | ICD-10-CM | POA: Insufficient documentation

## 2016-11-28 DIAGNOSIS — I509 Heart failure, unspecified: Secondary | ICD-10-CM | POA: Diagnosis not present

## 2016-11-28 DIAGNOSIS — I361 Nonrheumatic tricuspid (valve) insufficiency: Secondary | ICD-10-CM | POA: Diagnosis not present

## 2016-11-28 DIAGNOSIS — I340 Nonrheumatic mitral (valve) insufficiency: Secondary | ICD-10-CM | POA: Diagnosis not present

## 2016-11-28 DIAGNOSIS — Z7901 Long term (current) use of anticoagulants: Secondary | ICD-10-CM | POA: Diagnosis not present

## 2016-11-28 DIAGNOSIS — I110 Hypertensive heart disease with heart failure: Secondary | ICD-10-CM | POA: Insufficient documentation

## 2016-11-28 DIAGNOSIS — I420 Dilated cardiomyopathy: Secondary | ICD-10-CM

## 2016-11-28 DIAGNOSIS — I4892 Unspecified atrial flutter: Secondary | ICD-10-CM | POA: Diagnosis not present

## 2016-11-28 LAB — POCT INR: INR: 3.1

## 2016-12-04 ENCOUNTER — Ambulatory Visit (INDEPENDENT_AMBULATORY_CARE_PROVIDER_SITE_OTHER): Payer: BLUE CROSS/BLUE SHIELD | Admitting: *Deleted

## 2016-12-04 ENCOUNTER — Encounter: Payer: Self-pay | Admitting: Internal Medicine

## 2016-12-04 ENCOUNTER — Ambulatory Visit (INDEPENDENT_AMBULATORY_CARE_PROVIDER_SITE_OTHER): Payer: BLUE CROSS/BLUE SHIELD | Admitting: Internal Medicine

## 2016-12-04 VITALS — BP 126/80 | HR 93 | Ht 73.5 in | Wt 214.2 lb

## 2016-12-04 DIAGNOSIS — I471 Supraventricular tachycardia: Secondary | ICD-10-CM | POA: Diagnosis not present

## 2016-12-04 DIAGNOSIS — Z7901 Long term (current) use of anticoagulants: Secondary | ICD-10-CM

## 2016-12-04 DIAGNOSIS — I4892 Unspecified atrial flutter: Secondary | ICD-10-CM | POA: Diagnosis not present

## 2016-12-04 LAB — POCT INR: INR: 2.5

## 2016-12-04 NOTE — Patient Instructions (Signed)
Medication Instructions:  Stop warfarin (coumadin)  Labwork: None   Testing/Procedures: None   Follow-Up: Your physician recommends that you schedule a follow-up appointment in: 4 months with Dr Jens Som at the [facility].       If you need a refill on your cardiac medications before your next appointment, please call your pharmacy.

## 2016-12-04 NOTE — Progress Notes (Signed)
HPI John Pham returns today after undergoing catheter ablation of atrial flutter. He is a pleasant 56 yo man with a h/o atrial flutter who underwent catheter ablation several weeks ago. He was found to have typical atrial flutter and had a successful ablation. The patient has MR and DM and HTN. He feels well. No palpitations. No edema. He is back exercising.   No Known Allergies   Current Outpatient Prescriptions  Medication Sig Dispense Refill  . allopurinol (ZYLOPRIM) 300 MG tablet Take 1 tablet (300 mg total) by mouth daily. 30 tablet 3  . atorvastatin (LIPITOR) 20 MG tablet Take 20 mg by mouth daily.    . carvedilol (COREG) 12.5 MG tablet Take 1 1/2 tablets twice daily 90 tablet 3  . cetirizine (ZYRTEC) 10 MG tablet Take 10 mg by mouth daily.      . colchicine (COLCRYS) 0.6 MG tablet Take 0.6 mg by mouth daily as needed (gout flareup).     . fluticasone (FLONASE) 50 MCG/ACT nasal spray Place 1 spray into both nostrils 2 (two) times daily.    . furosemide (LASIX) 40 MG tablet Take 1 tablet (40 mg total) by mouth 2 (two) times daily. 60 tablet 11  . Ginseng 100 MG CAPS Take 100 mg by mouth daily.    Marland Kitchen glucosamine-chondroitin 500-400 MG tablet Take 1 tablet by mouth daily.    Marland Kitchen lisinopril (PRINIVIL,ZESTRIL) 20 MG tablet Take 1 tablet (20 mg total) by mouth daily. 30 tablet 2  . metFORMIN (GLUCOPHAGE) 500 MG tablet Take 1,000 mg by mouth 2 (two) times daily with a meal.     . Misc Natural Products (TART CHERRY ADVANCED PO) Take 1 oz by mouth daily. Cherry tart juice    . Multiple Vitamin (MULTIVITAMIN WITH MINERALS) TABS tablet Take 1 tablet by mouth daily.    Marland Kitchen warfarin (COUMADIN) 5 MG tablet TAKE AS DIRECTED BY COUMADIN CLINIC. NEED APPOINTMENT FOR REFILLS. 40 tablet 1   No current facility-administered medications for this visit.      Past Medical History:  Diagnosis Date  . Atrial flutter (HCC)   . Cardiomyopathy (HCC)   . CHF (congestive heart failure) (HCC)   . Heart murmur   .  HTN (hypertension)   . Seasonal allergies   . SVT (supraventricular tachycardia) (HCC)     ROS:   All systems reviewed and negative except as noted in the HPI.   Past Surgical History:  Procedure Laterality Date  . A-FLUTTER ABLATION N/A 11/13/2016   Procedure: A-Flutter Ablation;  Surgeon: Marinus Maw, MD;  Location: [facility] INVASIVE CV LAB;  Service: Cardiovascular;  Laterality: N/A;  . CARDIOVERSION N/A 05/07/2013   Procedure: CARDIOVERSION;  Surgeon: Wendall Stade, MD;  Location: [facility] ENDOSCOPY;  Service: Cardiovascular;  Laterality: N/A;  . SYNOVECTOMY       Family History  Problem Relation Age of Onset  . Hypertension Mother      Social History   Social History  . Marital status: Single    Spouse name: N/A  . Number of children: N/A  . Years of education: N/A   Occupational History  . Not on file.   Social History Main Topics  . Smoking status: Never Smoker  . Smokeless tobacco: Never Used  . Alcohol use Yes     Comment: occasionally  . Drug use: No  . Sexual activity: Not on file   Other Topics Concern  . Not on file   Social History Narrative  . No narrative on  file     BP 126/80   Pulse 93   Ht 6' 1.5" (1.867 m)   Wt 214 lb 4 oz (97.2 kg)   SpO2 98%   BMI 27.88 kg/m   Physical Exam:  Well appearing 56 year old man, NAD HEENT: Unremarkable Neck:  7 cm JVD, no thyromegally Lungs:  Clear with no wheezes, rales, or rhonchi. HEART:  Regular rate rhythm, no murmurs, no rubs, no clicks Abd:  soft, positive bowel sounds, no organomegally, no rebound, no guarding Ext:  2 plus pulses, no edema, no cyanosis, no clubbing Skin:  No rashes no nodules Neuro:  CN II through XII intact, motor grossly intact  EKG - NSR   Assess/Plan: 1. Atrial flutter - he is s/p ablation and is doing well. He has not had atrial fib. He had atrial tachy and AVNRT remotely. We will stop his warfarin 2. MR - he has a soft systolic murmur on exam. He will need followup with  Dr. Marsa Aris in 6 months. I suspect he will someday require MV repair. 3. Tachy induced CM - he is class 1 and is exercising. He will continue his current meds which he also uses for HTN 4. HTN heart disease - his blood pressure is fairly well controlled. He will continue his current meds.   Leonia Reeves.D.

## 2016-12-08 ENCOUNTER — Ambulatory Visit: Payer: BLUE CROSS/BLUE SHIELD | Admitting: Physician Assistant

## 2017-03-19 ENCOUNTER — Encounter: Payer: Self-pay | Admitting: Cardiology

## 2017-03-29 NOTE — Progress Notes (Deleted)
HPI: FU cardiomyopathy and atrial flutter; admitted in May 2012 for low output HF with an EF 25%. Underwent cath in May 2012 with no significant CAD but EF ~15-20%. Found to have atrial tach. Underwent ablation by Dr. Ladona Ridgel of 2 atrial tachs and 1 AVNRT. Cardiomyopathy felt to be tachy induced. Had cardioversions have atrial flutter in the past. In April 2018 he underwent ablation of atrial flutter. Last echocardiogram April 2018 showed ejection fraction 35-40% with grade 2 diastolic dysfunction. Moderate to severe mitral regurgitation. There was severe left atrial enlargement, mild right ventricular enlargement and mild right atrial enlargement. Moderate tricuspid regurgitation. Since he was last seen   Current Outpatient Prescriptions  Medication Sig Dispense Refill  . allopurinol (ZYLOPRIM) 300 MG tablet Take 1 tablet (300 mg total) by mouth daily. 30 tablet 3  . atorvastatin (LIPITOR) 20 MG tablet Take 20 mg by mouth daily.    . carvedilol (COREG) 12.5 MG tablet Take 1 1/2 tablets twice daily 90 tablet 3  . cetirizine (ZYRTEC) 10 MG tablet Take 10 mg by mouth daily.      . colchicine (COLCRYS) 0.6 MG tablet Take 0.6 mg by mouth daily as needed (gout flareup).     . fluticasone (FLONASE) 50 MCG/ACT nasal spray Place 1 spray into both nostrils 2 (two) times daily.    . furosemide (LASIX) 40 MG tablet Take 1 tablet (40 mg total) by mouth 2 (two) times daily. 60 tablet 11  . Ginseng 100 MG CAPS Take 100 mg by mouth daily.    Marland Kitchen glucosamine-chondroitin 500-400 MG tablet Take 1 tablet by mouth daily.    Marland Kitchen lisinopril (PRINIVIL,ZESTRIL) 20 MG tablet Take 1 tablet (20 mg total) by mouth daily. 30 tablet 2  . metFORMIN (GLUCOPHAGE) 500 MG tablet Take 1,000 mg by mouth 2 (two) times daily with a meal.     . Misc Natural Products (TART CHERRY ADVANCED PO) Take 1 oz by mouth daily. Cherry tart juice    . Multiple Vitamin (MULTIVITAMIN WITH MINERALS) TABS tablet Take 1 tablet by mouth daily.     No  current facility-administered medications for this visit.      Past Medical History:  Diagnosis Date  . Atrial flutter (HCC)   . Cardiomyopathy (HCC)   . CHF (congestive heart failure) (HCC)   . Heart murmur   . HTN (hypertension)   . Seasonal allergies   . SVT (supraventricular tachycardia) (HCC)     Past Surgical History:  Procedure Laterality Date  . A-FLUTTER ABLATION N/A 11/13/2016   Procedure: A-Flutter Ablation;  Surgeon: Marinus Maw, MD;  Location: [facility] INVASIVE CV LAB;  Service: Cardiovascular;  Laterality: N/A;  . CARDIOVERSION N/A 05/07/2013   Procedure: CARDIOVERSION;  Surgeon: Wendall Stade, MD;  Location: [facility] ENDOSCOPY;  Service: Cardiovascular;  Laterality: N/A;  . SYNOVECTOMY      Social History   Social History  . Marital status: Single    Spouse name: N/A  . Number of children: N/A  . Years of education: N/A   Occupational History  . Not on file.   Social History Main Topics  . Smoking status: Never Smoker  . Smokeless tobacco: Never Used  . Alcohol use Yes     Comment: occasionally  . Drug use: No  . Sexual activity: Not on file   Other Topics Concern  . Not on file   Social History Narrative  . No narrative on file    Family History  Problem Relation Age of Onset  . Hypertension Mother     ROS: no fevers or chills, productive cough, hemoptysis, dysphasia, odynophagia, melena, hematochezia, dysuria, hematuria, rash, seizure activity, orthopnea, PND, pedal edema, claudication. Remaining systems are negative.  Physical Exam: Well-developed well-nourished in no acute distress.  Skin is warm and dry.  HEENT is normal.  Neck is supple.  Chest is clear to auscultation with normal expansion.  Cardiovascular exam is regular rate and rhythm.  Abdominal exam nontender or distended. No masses palpated. Extremities show no edema. neuro grossly intact  ECG- personally reviewed  A/P  1  John MillersBrian Caresse Sedivy, MD

## 2017-04-03 ENCOUNTER — Ambulatory Visit: Payer: BLUE CROSS/BLUE SHIELD | Admitting: Cardiology

## 2017-06-06 NOTE — Progress Notes (Deleted)
HPI: FU cardiomyopathy and atrial flutter; admitted in May 2012 for low output HF with an EF 25%. Underwent cath in May 2012 with no significant CAD but EF ~15-20%. Found to have atrial tach. Underwent ablation by Dr. Ladona Ridgel of 2 atrial tachs and 1 AVNRT. Cardiomyopathy felt to be tachy induced. In July of 2012 he was found to have atrial flutter. He had TEE guided cardioversion. Patient saw Dr. Ladona Ridgel and had atrial flutter ablation April 2018. Last echocardiogram April 2018 showed ejection fraction of 35-40%, grade 2 diastolic dysfunction, moderate to severe mitral regurgitation, biatrial enlargement, mild right ventricular enlargement and moderate tricuspid regurgitation. Previous cardiomyopathy felt to be tachycardia mediated.  I have not seen patient since 2014. Since she was last seen   Current Outpatient Prescriptions  Medication Sig Dispense Refill  . allopurinol (ZYLOPRIM) 300 MG tablet Take 1 tablet (300 mg total) by mouth daily. 30 tablet 3  . atorvastatin (LIPITOR) 20 MG tablet Take 20 mg by mouth daily.    . carvedilol (COREG) 12.5 MG tablet Take 1 1/2 tablets twice daily 90 tablet 3  . cetirizine (ZYRTEC) 10 MG tablet Take 10 mg by mouth daily.      . colchicine (COLCRYS) 0.6 MG tablet Take 0.6 mg by mouth daily as needed (gout flareup).     . fluticasone (FLONASE) 50 MCG/ACT nasal spray Place 1 spray into both nostrils 2 (two) times daily.    . furosemide (LASIX) 40 MG tablet Take 1 tablet (40 mg total) by mouth 2 (two) times daily. 60 tablet 11  . Ginseng 100 MG CAPS Take 100 mg by mouth daily.    Marland Kitchen glucosamine-chondroitin 500-400 MG tablet Take 1 tablet by mouth daily.    Marland Kitchen lisinopril (PRINIVIL,ZESTRIL) 20 MG tablet Take 1 tablet (20 mg total) by mouth daily. 30 tablet 2  . metFORMIN (GLUCOPHAGE) 500 MG tablet Take 1,000 mg by mouth 2 (two) times daily with a meal.     . Misc Natural Products (TART CHERRY ADVANCED PO) Take 1 oz by mouth daily. Cherry tart juice    .  Multiple Vitamin (MULTIVITAMIN WITH MINERALS) TABS tablet Take 1 tablet by mouth daily.     No current facility-administered medications for this visit.      Past Medical History:  Diagnosis Date  . Atrial flutter (HCC)   . Cardiomyopathy (HCC)   . CHF (congestive heart failure) (HCC)   . Heart murmur   . HTN (hypertension)   . Seasonal allergies   . SVT (supraventricular tachycardia) (HCC)     Past Surgical History:  Procedure Laterality Date  . A-FLUTTER ABLATION N/A 11/13/2016   Procedure: A-Flutter Ablation;  Surgeon: Marinus Maw, MD;  Location[facility] INVASIVE CV LAB;  Service: Cardiovascular;  Laterality: N/A;  . CARDIOVERSION N/A 05/07/2013   Procedure: CARDIOVERSION;  Surgeon: Wendall Stade, MD;  Location: [facility] ENDOSCOPY;  Service: Cardiovascular;  Laterality: N/A;  . SYNOVECTOMY      Social History   Social History  . Marital status: Single    Spouse name: N/A  . Number of children: N/A  . Years of education: N/A   Occupational History  . Not on file.   Social History Main Topics  . Smoking status: Never Smoker  . Smokeless tobacco: Never Used  . Alcohol use Yes     Comment: occasionally  . Drug use: No  . Sexual activity: Not on file   Other Topics Concern  . Not on file  Social History Narrative  . No narrative on file    Family History  Problem Relation Age of Onset  . Hypertension Mother     ROS: no fevers or chills, productive cough, hemoptysis, dysphasia, odynophagia, melena, hematochezia, dysuria, hematuria, rash, seizure activity, orthopnea, PND, pedal edema, claudication. Remaining systems are negative.  Physical Exam: Well-developed well-nourished in no acute distress.  Skin is warm and dry.  HEENT is normal.  Neck is supple.  Chest is clear to auscultation with normal expansion.  Cardiovascular exam is regular rate and rhythm.  Abdominal exam nontender or distended. No masses palpated. Extremities show no edema. neuro grossly  intact  ECG- personally reviewed  A/P  1  John MillersBrian Crenshaw, MD

## 2017-06-14 ENCOUNTER — Ambulatory Visit: Payer: BLUE CROSS/BLUE SHIELD | Admitting: Cardiology

## 2017-08-29 NOTE — Progress Notes (Signed)
HPI: FU cardiomyopathy and atrial flutter; admitted in May 2012 for low output HF with an EF 25%. Underwent cath in May 2012 with no significant CAD but EF ~15-20%. Found to have atrial tach. Underwent ablation by Dr. Ladona Ridgelaylor of 2 atrial tachs and 1 AVNRT. Cardiomyopathy felt to be tachy induced. Patient had atrial flutter ablation in April 2018. Echocardiogram April 2018 showed ejection fraction 35-40%, grade 2 diastolic dysfunction, moderate to severe mitral regurgitation, severe left atrial enlargement, mild right atrial enlargement, mild right ventricular enlargement, moderate tricuspid regurgitation. I have not seen since 2014. Since that time the patient denies any dyspnea on exertion, orthopnea, PND, pedal edema, palpitations, syncope or chest pain.   Current Outpatient Medications  Medication Sig Dispense Refill  . allopurinol (ZYLOPRIM) 300 MG tablet Take 1 tablet (300 mg total) by mouth daily. 30 tablet 3  . atorvastatin (LIPITOR) 20 MG tablet Take 20 mg by mouth daily.    . carvedilol (COREG) 12.5 MG tablet Take 1 1/2 tablets twice daily 90 tablet 3  . colchicine (COLCRYS) 0.6 MG tablet Take 0.6 mg by mouth daily as needed (gout flareup).     . fluticasone (FLONASE) 50 MCG/ACT nasal spray Place 1 spray into both nostrils 2 (two) times daily.    . furosemide (LASIX) 40 MG tablet Take 1 tablet (40 mg total) by mouth 2 (two) times daily. 60 tablet 11  . Ginseng 100 MG CAPS Take 100 mg by mouth daily.    Marland Kitchen. glucosamine-chondroitin 500-400 MG tablet Take 1 tablet by mouth daily.    Marland Kitchen. lisinopril (PRINIVIL,ZESTRIL) 20 MG tablet Take 1 tablet (20 mg total) by mouth daily. 30 tablet 2  . Loratadine 10 MG CAPS Take 1 capsule by mouth daily.    . metFORMIN (GLUCOPHAGE) 500 MG tablet Take 1,000 mg by mouth 2 (two) times daily with a meal.     . Misc Natural Products (TART CHERRY ADVANCED PO) Take 1 oz by mouth daily. Cherry tart juice    . Multiple Vitamin (MULTIVITAMIN WITH MINERALS) TABS  tablet Take 1 tablet by mouth daily.     No current facility-administered medications for this visit.      Past Medical History:  Diagnosis Date  . Atrial flutter (HCC)   . Cardiomyopathy (HCC)   . CHF (congestive heart failure) (HCC)   . Heart murmur   . HTN (hypertension)   . Seasonal allergies   . SVT (supraventricular tachycardia) (HCC)     Past Surgical History:  Procedure Laterality Date  . A-FLUTTER ABLATION N/A 11/13/2016   Procedure: A-Flutter Ablation;  Surgeon: Marinus MawGregg W Taylor, MD;  [facility];  Service: Cardiovascular;  Laterality: N/A;  . CARDIOVERSION N/A 05/07/2013   Procedure: CARDIOVERSION;  Surgeon: Wendall StadePeter C Nishan, MD;  [facility];  Service: Cardiovascular;  Laterality: N/A;  . SYNOVECTOMY      Social History   Socioeconomic History  . Marital status: Single    Spouse name: Not on file  . Number of children: Not on file  . Years of education: Not on file  . Highest education level: Not on file  Social Needs  . Financial resource strain: Not on file  . Food insecurity - worry: Not on file  . Food insecurity - inability: Not on file  . Transportation needs - medical: Not on file  . Transportation needs - non-medical: Not on file  Occupational History  . Not on file  Tobacco Use  .  Smoking status: Never Smoker  . Smokeless tobacco: Never Used  Substance and Sexual Activity  . Alcohol use: Yes    Comment: occasionally  . Drug use: No  . Sexual activity: Not on file  Other Topics Concern  . Not on file  Social History Narrative  . Not on file    Family History  Problem Relation Age of Onset  . Hypertension Mother     ROS: no fevers or chills, productive cough, hemoptysis, dysphasia, odynophagia, melena, hematochezia, dysuria, hematuria, rash, seizure activity, orthopnea, PND, pedal edema, claudication. Remaining systems are negative.  Physical Exam: Well-developed well-nourished in no acute distress.  Skin is warm  and dry.  HEENT is normal.  Neck is supple.  Chest is clear to auscultation with normal expansion.  Cardiovascular exam is regular rate and rhythm. 2/6 systolic murmur apex Abdominal exam nontender or distended. No masses palpated. Extremities show no edema. neuro grossly intact  ECG-sinus rhythm at a rate of 92.  Left ventricular hypertrophy.  Nonspecific ST changes.  Personally reviewed  A/P  1 cardiomyopathy-previously felt to be tachycardia mediated.  Continue ACE inhibitor and beta-blocker (increase lisinopril to 40 mg daily).  Repeat echocardiogram in 8 weeks.  2 mitral regurgitation-felt possibly secondary to cardiomyopathy/annular dilatation previously.  Hopefully will have improved if LV function improves.  Repeat echocardiogram.  3 hypertension-blood pressure is borderline.  Increase lisinopril to 40 mg daily.  4 atrial flutter-status post ablation.  Anticoagulation has been discontinued by Dr. Ladona Ridgel.  5 chronic systolic congestive heart failure-continue present dose of Lasix; check K and renal function.   Olga Millers, MD

## 2017-08-31 IMAGING — CR DG CHEST 2V
2 series · 2 of 2 positions shown · non-contrast
Comparison: 01/02/2011

CLINICAL DATA: Lower extremity swelling and abdominal distension

EXAM:
CHEST  2 VIEW

[chest pa]
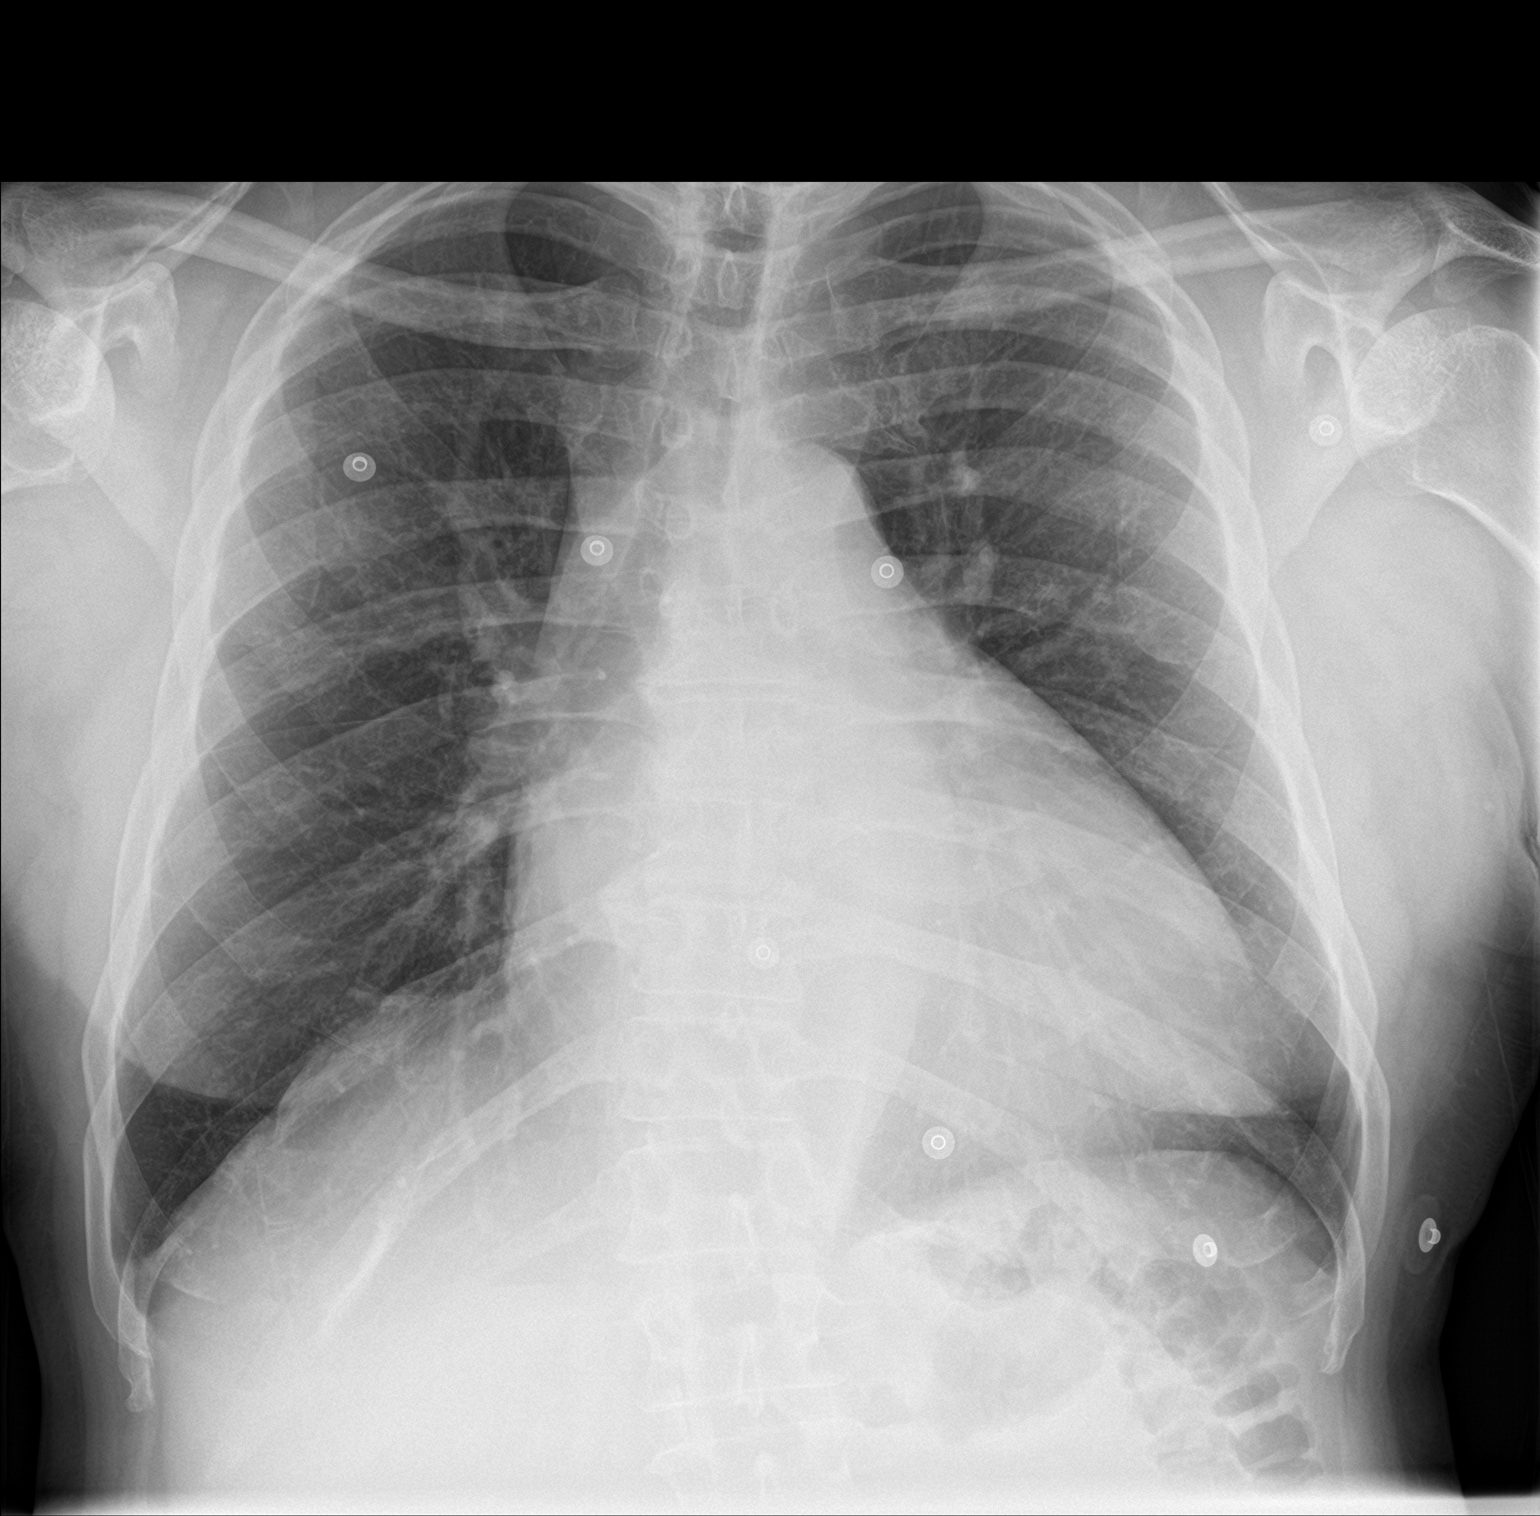

[chest lat]
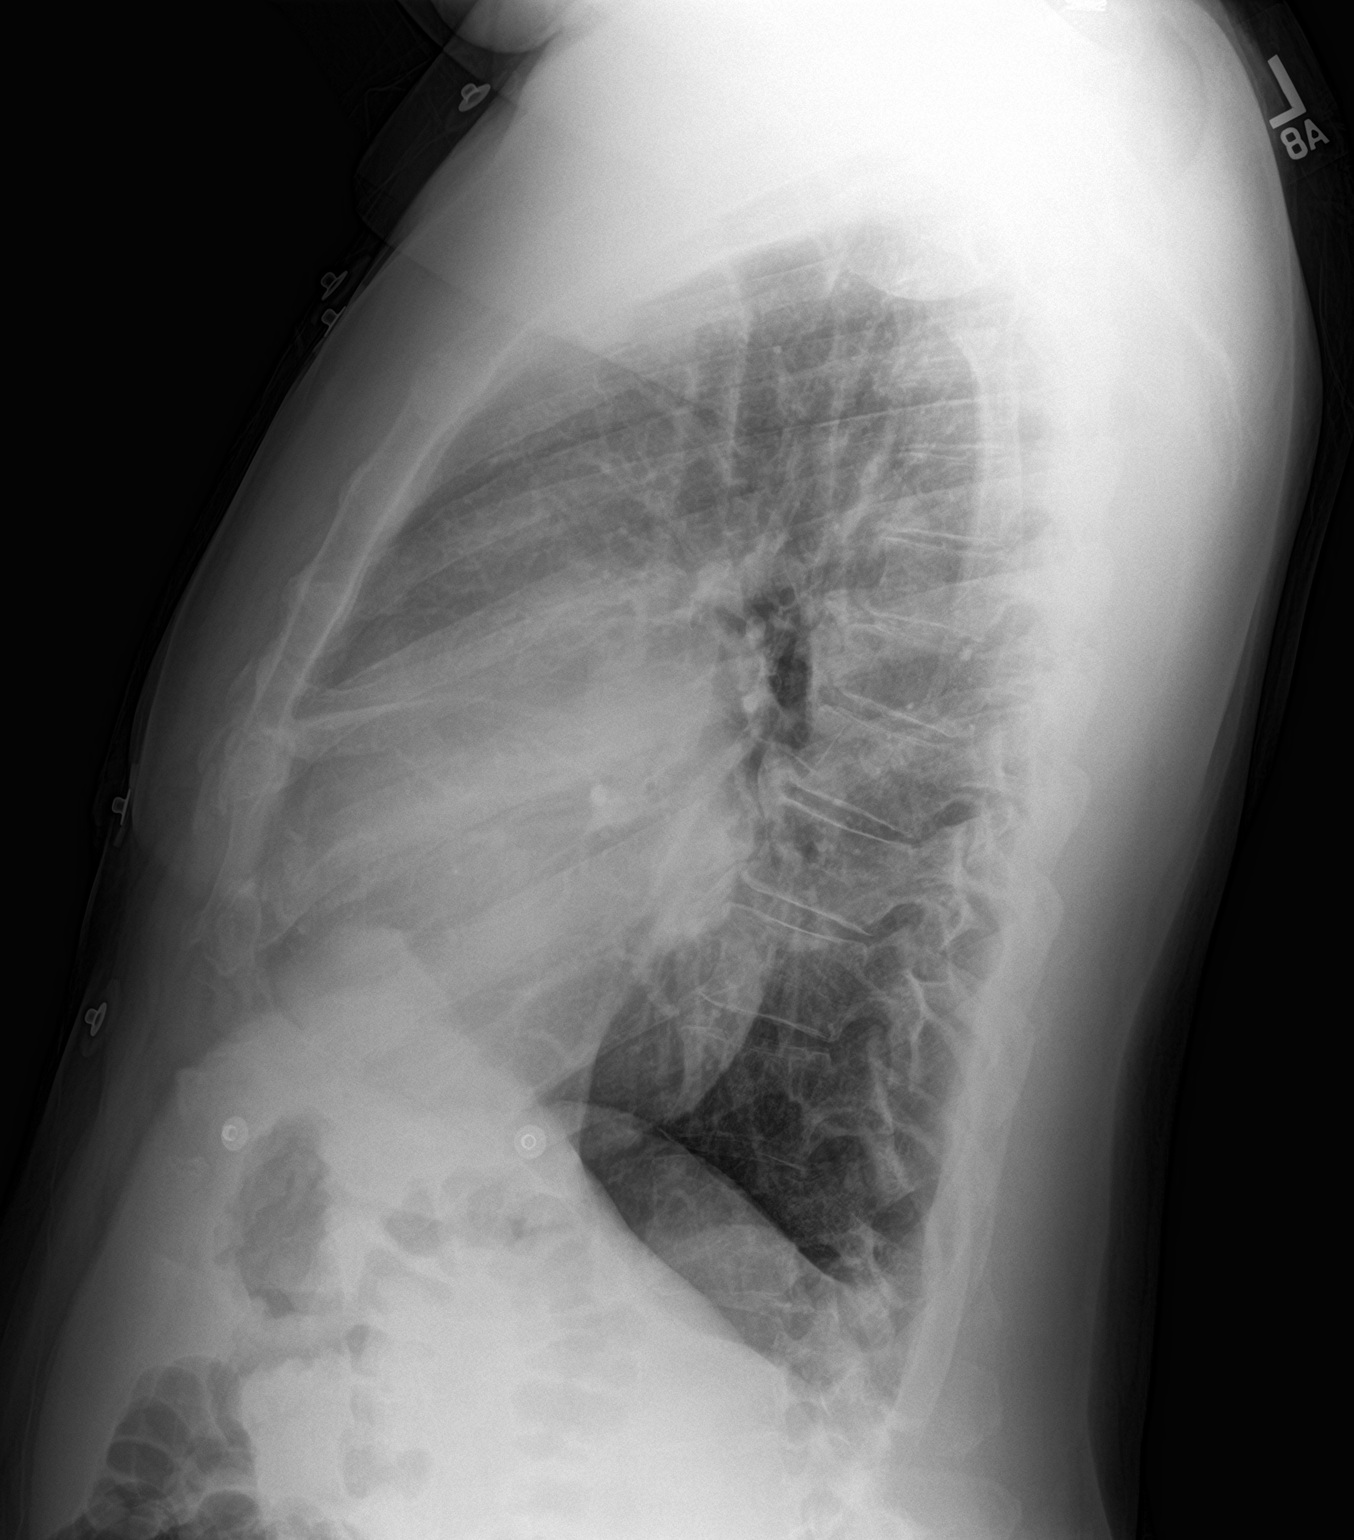

[2 of 2 positions shown; findings below may reference images not displayed]

FINDINGS: There is no focal parenchymal opacity. There is no pleural effusion
or pneumothorax. There is stable cardiomegaly.

The osseous structures are unremarkable.
IMPRESSION: No active cardiopulmonary disease.

## 2017-09-04 ENCOUNTER — Ambulatory Visit: Payer: BLUE CROSS/BLUE SHIELD | Admitting: Cardiology

## 2017-09-04 ENCOUNTER — Encounter: Payer: Self-pay | Admitting: Cardiology

## 2017-09-04 VITALS — BP 134/94 | HR 92 | Ht 73.5 in | Wt 239.0 lb

## 2017-09-04 DIAGNOSIS — I420 Dilated cardiomyopathy: Secondary | ICD-10-CM | POA: Diagnosis not present

## 2017-09-04 DIAGNOSIS — I10 Essential (primary) hypertension: Secondary | ICD-10-CM

## 2017-09-04 DIAGNOSIS — I483 Typical atrial flutter: Secondary | ICD-10-CM

## 2017-09-04 DIAGNOSIS — I340 Nonrheumatic mitral (valve) insufficiency: Secondary | ICD-10-CM

## 2017-09-04 MED ORDER — LISINOPRIL 40 MG PO TABS: 40.0000 mg | ORAL_TABLET | Freq: Every day | ORAL | Status: DC

## 2017-09-04 NOTE — Addendum Note (Signed)
Addended by: Sandi MariscalICHARDSON, Esteen Delpriore Y on: 09/04/2017 04:41 PM   Modules accepted: Orders

## 2017-09-04 NOTE — Patient Instructions (Signed)
Medication Instructions: INCREASE Lisinopril to 40 mg daily   If you need a refill on your cardiac medications before your next appointment, please call your pharmacy.   Labwork: Your physician recommends that you return for lab work in: one week for a BMET   Procedures/Testing: Your physician has requested that you have an echocardiogram in 8 weeks.Echocardiography is a painless test that uses sound waves to create images of your heart. It provides your doctor with information about the size and shape of your heart and how well your heart's chambers and valves are working. This procedure takes approximately one hour. There are no restrictions for this procedure. This will be done at [facility], [address]   Follow-Up: Your physician wants you to follow-up in: 6 months with Dr. Jens Somrenshaw. You will receive a reminder letter in the mail two months in advance. If you don't receive a letter, please call our office at [phone] to schedule this follow-up appointment.    Thank you for choosing [facility]!!

## 2017-10-09 ENCOUNTER — Encounter: Payer: Self-pay | Admitting: *Deleted

## 2017-11-05 ENCOUNTER — Other Ambulatory Visit (HOSPITAL_COMMUNITY): Payer: BLUE CROSS/BLUE SHIELD

## 2017-11-14 ENCOUNTER — Encounter (HOSPITAL_COMMUNITY): Payer: Self-pay | Admitting: Radiology

## 2017-12-12 ENCOUNTER — Other Ambulatory Visit: Payer: Self-pay

## 2017-12-12 ENCOUNTER — Telehealth: Payer: Self-pay | Admitting: Cardiology

## 2017-12-12 MED ORDER — LISINOPRIL 40 MG PO TABS: 40.0000 mg | ORAL_TABLET | Freq: Every day | ORAL | 2 refills | Status: DC

## 2017-12-12 NOTE — Telephone Encounter (Signed)
Spoke with Pt and advise that a refill for Lisinopril 40 mg would be sent to the pharmacy and instructed to only take 1 tablet PO daily. Verbalized Understanding.

## 2017-12-12 NOTE — Telephone Encounter (Signed)
New message  Pt c/o medication issue:  1. Name of Medication: lisinopril (PRINIVIL,ZESTRIL) 40 MG tablet  2. How are you currently taking this medication (dosage and times per day)? 2 tablets a day  3. Are you having a reaction (difficulty breathing--STAT)? No  4. What is your medication issue? Patient states he has been taking 2 tablets a day. He is out of medication. Pharmacy unable to refill until May 12. Requesting new script       Which pharmacy/location (including street and city if local pharmacy) is medication to be sent to? [facility] [address] [place], [place] - [address]

## 2017-12-26 ENCOUNTER — Other Ambulatory Visit (HOSPITAL_COMMUNITY): Payer: BLUE CROSS/BLUE SHIELD

## 2018-01-08 ENCOUNTER — Other Ambulatory Visit: Payer: Self-pay

## 2018-01-08 ENCOUNTER — Ambulatory Visit (HOSPITAL_COMMUNITY): Payer: BLUE CROSS/BLUE SHIELD | Attending: Cardiology

## 2018-01-08 ENCOUNTER — Other Ambulatory Visit: Payer: Self-pay | Admitting: Cardiology

## 2018-01-08 DIAGNOSIS — I471 Supraventricular tachycardia: Secondary | ICD-10-CM | POA: Diagnosis not present

## 2018-01-08 DIAGNOSIS — I272 Pulmonary hypertension, unspecified: Secondary | ICD-10-CM | POA: Diagnosis not present

## 2018-01-08 DIAGNOSIS — I340 Nonrheumatic mitral (valve) insufficiency: Secondary | ICD-10-CM | POA: Insufficient documentation

## 2018-01-08 DIAGNOSIS — I110 Hypertensive heart disease with heart failure: Secondary | ICD-10-CM | POA: Insufficient documentation

## 2018-01-08 DIAGNOSIS — I420 Dilated cardiomyopathy: Secondary | ICD-10-CM | POA: Diagnosis not present

## 2018-01-08 DIAGNOSIS — I509 Heart failure, unspecified: Secondary | ICD-10-CM | POA: Insufficient documentation

## 2018-01-29 ENCOUNTER — Encounter: Payer: Self-pay | Admitting: *Deleted

## 2018-05-24 ENCOUNTER — Other Ambulatory Visit: Payer: Self-pay | Admitting: Cardiology

## 2018-05-24 NOTE — Telephone Encounter (Signed)
Rx request sent to pharmacy.  

## 2018-09-05 ENCOUNTER — Other Ambulatory Visit: Payer: Self-pay | Admitting: Cardiology

## 2018-10-14 ENCOUNTER — Other Ambulatory Visit: Payer: Self-pay | Admitting: Cardiology

## 2018-10-30 ENCOUNTER — Other Ambulatory Visit: Payer: Self-pay | Admitting: Cardiology

## 2019-02-06 ENCOUNTER — Other Ambulatory Visit: Payer: Self-pay | Admitting: Cardiology

## 2019-05-22 ENCOUNTER — Other Ambulatory Visit: Payer: Self-pay | Admitting: Cardiology

## 2019-05-22 NOTE — Telephone Encounter (Signed)
Pt overdue for 6 month f/u.  Please contact pt for future appointment. 

## 2019-05-25 ENCOUNTER — Other Ambulatory Visit: Payer: Self-pay | Admitting: Cardiology

## 2019-10-20 ENCOUNTER — Ambulatory Visit: Payer: BLUE CROSS/BLUE SHIELD | Admitting: General Practice

## 2019-11-15 NOTE — Progress Notes (Signed)
Cardiology Office Note:    Date:  11/20/2019   ID:  John Pham, DOB Aug 03, 1961, MRN [id number]  PCP:  Ileana Ladd, MD  Cardiologist:  Olga Millers, MD  Electrophysiologist:  None   Referring MD: Ileana Ladd, MD   Chief Complaint: follow-up of chronic CHF, atrial flutter, and SVT  History of Present Illness:    John Pham is a 59 y.o. male with a history of chronic systolic CHF/ tachy-induced cardiomyopathy  with EF of 35-40% in 12/2017, atrial flutter s/p ablation in 11/2016 no longer on anticoagulation, SVT s/p ablation in 2012, and hypertension who is followed by Dr. Jens Som and presents today for routine follow-up.   Patient was admitted in 12/2010 with low output CHF with an EF of 25%. He underwent a cardiac catheterization which showed no significant CAD but an EF of 15-20%. He was found to have atrial tachycardia, and Dr. Ladona Ridgel performed ablation of 2 atrial tachycardias and 1 AVNRT. Cardiomyopathy was felt to be tachy-induced. Patient then underwent atrial flutter ablation in 11/2016. EF at that time showed EF of 35-40% with grade 2 diastolic dysfunction, moderate to severe mitral regurgitation, severe atrial enlargement, mild right atrial enlargement, mild RV enlargement, and moderate tricuspid regurgitation. Patient was last seen by Dr. Jens Som in 08/2017 after not seeing him since 2014. He was doing well at that time from a cardiac standpoint. Repeat Echo was ordered. This was performed in 12/2017 and showed LVEF of 35-40% with diffuse hypokinesis, mild LVH, grade 1 diastolic dysfunction, mild MR, mild left atrial enlargement, and mild TR with mild pulmonary hypertension.   Patient presents today for follow-up. Here alone. Patient states he has been doing great since last visit. He denies any cardiac symptoms including chest pain, shortness of breath, orthopnea, PND, edema, palpitations, lightheadedness, dizziness, or syncope. His BP is mildly elevated today at 145/95  (138/90 on my recheck) but he has been out of his Coreg for the past week. He following a low sodium diet and is staying very active. 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 No complaints today.    Past Medical History:  Diagnosis Date  . Atrial flutter (HCC)   . Cardiomyopathy (HCC)   . CHF (congestive heart failure) (HCC)   . Heart murmur   . HTN (hypertension)   . Seasonal allergies   . SVT (supraventricular tachycardia) (HCC)     Past Surgical History:  Procedure Laterality Date  . A-FLUTTER ABLATION N/A 11/13/2016   Procedure: A-Flutter Ablation;  Surgeon: Marinus Maw, MD;  Location: [facility] INVASIVE CV LAB;  Service: Cardiovascular;  Laterality: N/A;  . CARDIOVERSION N/A 05/07/2013   Procedure: CARDIOVERSION;  Surgeon: Wendall Stade, MD;  Location: [facility] ENDOSCOPY;  Service: Cardiovascular;  Laterality: N/A;  . SYNOVECTOMY      Current Medications: Current Meds  Medication Sig  . allopurinol (ZYLOPRIM) 300 MG tablet Take 1 tablet (300 mg total) by mouth daily.  Marland Kitchen atorvastatin (LIPITOR) 20 MG tablet Take 1 tablet (20 mg total) by mouth daily.  . carvedilol (COREG) 12.5 MG tablet Take 1 tablet (12.5 mg total) by mouth 2 (two) times daily with a meal. Take 1 1/2 tablets twice daily  . colchicine (COLCRYS) 0.6 MG tablet Take 0.6 mg by mouth daily as needed (gout flareup).   . fluticasone (FLONASE) 50 MCG/ACT nasal spray Place 1 spray into both nostrils 2 (two)  times daily.  . furosemide (LASIX) 40 MG tablet Take 1 tablet (40 mg total) by mouth 2 (two) times daily.  . Ginseng 100 MG CAPS Take 100 mg by mouth daily.  Marland Kitchen. glucosamine-chondroitin 500-400 MG tablet Take 1 tablet by mouth daily.  Marland Kitchen. lisinopril (ZESTRIL) 40 MG tablet Take 1 tablet by mouth once daily  . Loratadine 10 MG CAPS Take 1 capsule by mouth daily.  . metFORMIN (GLUCOPHAGE) 500 MG tablet Take 1,000 mg by mouth 2 (two) times daily with a meal.   . Misc  Natural Products (TART CHERRY ADVANCED PO) Take 1 oz by mouth daily. Cherry tart juice  . Multiple Vitamin (MULTIVITAMIN WITH MINERALS) TABS tablet Take 1 tablet by mouth daily.  . [DISCONTINUED] atorvastatin (LIPITOR) 20 MG tablet Take 20 mg by mouth daily.  . [DISCONTINUED] carvedilol (COREG) 12.5 MG tablet Take 1 1/2 tablets twice daily     Allergies:   Patient has no known allergies.   Social History   Socioeconomic History  . Marital status: Single    Spouse name: Not on file  . Number of children: Not on file  . Years of education: Not on file  . Highest education level: Not on file  Occupational History  . Not on file  Tobacco Use  . Smoking status: Never Smoker  . Smokeless tobacco: Never Used  Substance and Sexual Activity  . Alcohol use: Yes    Comment: occasionally  . Drug use: No  . Sexual activity: Not on file  Other Topics Concern  . Not on file  Social History Narrative  . Not on file   Social Determinants of Health   Financial Resource Strain:   . Difficulty of Paying Living Expenses:   Food Insecurity:   . Worried About Programme researcher, broadcasting/film/videounning Out of Food in the Last Year:   . Baristaan Out of Food in the Last Year:   Transportation Needs:   . Freight forwarderLack of Transportation (Medical):   Marland Kitchen. Lack of Transportation (Non-Medical):   Physical Activity:   . Days of Exercise per Week:   . Minutes of Exercise per Session:   Stress:   . Feeling of Stress :   Social Connections:   . Frequency of Communication with Friends and Family:   . Frequency of Social Gatherings with Friends and Family:   . Attends Religious Services:   . Active Member of Clubs or Organizations:   . Attends BankerClub or Organization Meetings:   Marland Kitchen. Marital Status:      Family History: The patient's family history includes Hypertension in his mother.  ROS:   Please see the history of present illness.      EKGs/Labs/Other Studies Reviewed:    The following studies were reviewed today:  Cardiac Catheterization  12/30/2010: Assessment: 1. Severe nonischemic cardiomyopathy with low output heart failure,     but based on thermodilution. 2. Significant tachycardia.  I suspect this is sinus tachycardia     versus atrial tachycardia.  I have reviewed this with Dr. Johney FrameAllred.     He did not have any response to adenosine. 3. No evidence of intracardiac shunting. 4. Normal coronary arteries. _______________  Atrial Flutter Ablation 11/13/2016: Conclusions: 1. Isthmus-dependent right atrial flutter upon presentation.  2. Successful radiofrequency ablation of atrial flutter along the cavotricuspid isthmus with complete bidirectional isthmus block achieved.  3. No inducible arrhythmias following ablation.  4. No early apparent complications.  _______________  Echocardiogram 11/28/2016: Study Conclusions: - Left ventricle: The cavity size was moderately  dilated. Wall  thickness was increased in a pattern of mild LVH. Systolic  function was moderately reduced. The estimated ejection fraction  was in the range of 35% to 40%. Diffuse hypokinesis. Doppler  parameters are consistent with abnormal left ventricular  relaxation (grade 1 diastolic dysfunction).  - Mitral valve: There was mild regurgitation.  - Left atrium: The atrium was mildly dilated.  - Pulmonary arteries: Systolic pressure was mildly increased.   Impressions:  - Moderate global reduction in LV systolic function; mild LVH;  moderate LVE; mild diastolic dysfunction; mild MR; mild LAE; mild  TR with mild pulmonary hypertension.  EKG:  EKG ordered today. EKG personally reviewed and demonstrates normal sinus rhythm, rate 87 bpm, with PVC, T wave inversions in lead III and aVF but these are not new and have been seen on prior tracings. PR and QRS interval normal. QTc 483 ms but relatively stable.   Recent Labs: No results found for requested labs within last 8760 hours.  Recent Lipid Panel    Component Value Date/Time   CHOL 153  07/13/2012 1119   TRIG 130 07/13/2012 1119   HDL 65 07/13/2012 1119   CHOLHDL 2.4 07/13/2012 1119   VLDL 26 07/13/2012 1119   LDLCALC 62 07/13/2012 1119    Physical Exam:    Vital Signs: BP (!) 145/95   Pulse 87   Ht 6' 1.5" (1.867 m)   Wt 231 lb (104.8 kg)   SpO2 99%   BMI 30.06 kg/m     Wt Readings from Last 3 Encounters:  11/20/19 231 lb (104.8 kg)  09/04/17 239 lb (108.4 kg)  12/04/16 214 lb 4 oz (97.2 kg)     General: 59 y.o. male in no acute distress. HEENT: Normocephalic and atraumatic. Sclera clear. EOMs intact. Neck: Supple. No carotid bruits. No JVD. Heart: RRR with occasional ectopy. Distinct S1 and S2. No murmurs, gallops, or rubs. Radial pulses 2+ and equal bilaterally.  Lungs: No increased work of breathing. Clear to ausculation bilaterally. No wheezes, rhonchi, or rales.  Abdomen: Soft, non-distended, and non-tender to palpation. Bowel sounds present.  Extremities: No lower extremity edema.    Skin: Warm and dry. Neuro: No focal deficits. Psych: Normal affect. Responds appropriately.   Assessment:    1. Chronic systolic heart failure (HCC)   2. Tachycardia induced cardiomyopathy (HCC)   3. Paroxysmal atrial flutter (HCC)   4. SVT (supraventricular tachycardia) (HCC)   5. Mitral valve insufficiency, unspecified etiology   6. Hypertension     Plan:    Chronic Systolic CHF/ Tachy-Induced Cardiomyopathy - LVEF of 35-40% on last Echo in 12/2017.  - Patient appears euvolemic on exam.  - Continue Lasix 40mg  twice daily.  - Continue Lisinopril 40mg  daily and Coreg 12.5mg  daily.  - Patient has visit with PCP next month and is scheduled to have labs drawn at that time. Asked for him to have these labs faxed to .   Atrial Flutter/ SVT - S/p 2 atrial tachycardia ablations and 1 AVNRT ablation in 2012. Also s/p atrial flutter ablation in 11/2016.  - No palpitations.  - Continue beta-blocker as above  - Anticoagulation discontinued by Dr. 2013 after  atrial flutter ablation.   Mitral Regurgitation - Echo in 11/2016 showed moderate to severe MR. However, repeat Echo in 12/2017 showed only mild MR.  - Felt to likely be secondary to cardiomyopathy/annular dilatation.  - Can consider repeat Echo next year but will defer to Dr. 12/2016.   Hypertension - BP mildly  elevated at 145/95 (138/90 on my recheck). However, patient has been out of Coreg for 1 week.  - Continue Lisinopril 40mg  daily and Coreg 12.5mg  twice daily for now. Will refill Coreg.  - Patient to monitor BP at home and let us know if consistent above goal of <130/80 after restarting Coreg.  Dyslipidemia - Continue Lipitor 20mg  daily. Will refill this today. - Labs followed by PCP. He has visit with PCP next month and is scheduled to have labs drawn at that time. Have asked patient to have these lab results faxed to [place].   Type 2 Diabetes Mellitus - Management per primary team.   Disposition: Follow up in 1 year with Dr. Stanford Breed.    Medication Adjustments/Labs and Tests Ordered: Current medicines are reviewed at length with the patient today.  Concerns regarding medicines are outlined above.  Orders Placed This Encounter  Procedures  . EKG 12-Lead   Meds ordered this encounter  Medications  . atorvastatin (LIPITOR) 20 MG tablet    Sig: Take 1 tablet (20 mg total) by mouth daily.    Dispense:  90 tablet    Refill:  3  . carvedilol (COREG) 12.5 MG tablet    Sig: Take 1 tablet (12.5 mg total) by mouth 2 (two) times daily with a meal. Take 1 1/2 tablets twice daily    Dispense:  270 tablet    Refill:  3    Patient Instructions  Medication Instructions:  Your physician recommends that you continue on your current medications as directed. Please refer to the Current Medication list given to you today.  *If you need a refill on your cardiac medications before your next appointment, please call your pharmacy*   Lab Work: TO HAVE LABS DONE AT [facility].  PLEASE HAVE YOU PROVIDER FAX OVER RESULTS TO OUR OFFICE. FAX NUMBER: [phone]  If you have labs (blood work) drawn today and your tests are completely normal, you will receive your results only by: Marland Kitchen MyChart Message (if you have MyChart) OR . A paper copy in the mail If you have any lab test that is abnormal or we need to change your treatment, we will call you to review the results.   Testing/Procedures: NONE  Follow-Up: At [facility], you and your health needs are our priority.  As part of our continuing mission to provide you with exceptional heart care, we have created designated Provider Care Teams.  These Care Teams include your primary Cardiologist (physician) and Advanced Practice Providers (APPs -  Physician Assistants and Nurse Practitioners) who all work together to provide you with the care you need, when you need it.  We recommend signing up for the patient portal called "MyChart".  Sign up information is provided on this After Visit Summary.  MyChart is used to connect with patients for Virtual Visits (Telemedicine).  Patients are able to view lab/test results, encounter notes, upcoming appointments, etc.  Non-urgent messages can be sent to your provider as well.   To learn more about what you can do with MyChart, go to [email or web address].    Your next appointment:   1 year(s)  The format for your next appointment:   In Person  Provider:   You may see Kirk Ruths, MD or one of the following Advanced Practice Providers on your designated Care Team:    Kerin Ransom, PA-C  [place], [place]  Coletta Memos, [place]    Other Instructions  IF YOUR BLOOD PRESSURE CONSISTENTLY RUNS OVER 130/80 AFTER  STARTING YOU CARVEDILOL BACK PLEASE CALL OUR OFFICE TO LET us KNOW.        Signed, Corrin Parker, PA-C  11/20/2019 11:14 AM    [facility]

## 2019-11-20 ENCOUNTER — Ambulatory Visit (INDEPENDENT_AMBULATORY_CARE_PROVIDER_SITE_OTHER): Payer: 59 | Admitting: Student

## 2019-11-20 ENCOUNTER — Other Ambulatory Visit: Payer: Self-pay

## 2019-11-20 ENCOUNTER — Encounter: Payer: Self-pay | Admitting: Student

## 2019-11-20 VITALS — BP 145/95 | HR 87 | Ht 73.5 in | Wt 231.0 lb

## 2019-11-20 DIAGNOSIS — I43 Cardiomyopathy in diseases classified elsewhere: Secondary | ICD-10-CM

## 2019-11-20 DIAGNOSIS — I471 Supraventricular tachycardia: Secondary | ICD-10-CM | POA: Diagnosis not present

## 2019-11-20 DIAGNOSIS — I10 Essential (primary) hypertension: Secondary | ICD-10-CM

## 2019-11-20 DIAGNOSIS — R000 Tachycardia, unspecified: Secondary | ICD-10-CM

## 2019-11-20 DIAGNOSIS — I4892 Unspecified atrial flutter: Secondary | ICD-10-CM | POA: Diagnosis not present

## 2019-11-20 DIAGNOSIS — I5022 Chronic systolic (congestive) heart failure: Secondary | ICD-10-CM | POA: Diagnosis not present

## 2019-11-20 DIAGNOSIS — I340 Nonrheumatic mitral (valve) insufficiency: Secondary | ICD-10-CM

## 2019-11-20 MED ORDER — ATORVASTATIN CALCIUM 20 MG PO TABS: 20.0000 mg | ORAL_TABLET | Freq: Every day | ORAL | 3 refills | Status: DC

## 2019-11-20 MED ORDER — CARVEDILOL 12.5 MG PO TABS: 12.5000 mg | ORAL_TABLET | Freq: Two times a day (BID) | ORAL | 3 refills | Status: DC

## 2019-11-20 NOTE — Patient Instructions (Signed)
Medication Instructions:  Your physician recommends that you continue on your current medications as directed. Please refer to the Current Medication list given to you today.  *If you need a refill on your cardiac medications before your next appointment, please call your pharmacy*   Lab Work: TO HAVE LABS DONE AT [facility]. PLEASE HAVE YOU PROVIDER FAX OVER RESULTS TO OUR OFFICE. FAX NUMBER: [phone]  If you have labs (blood work) drawn today and your tests are completely normal, you will receive your results only by: Marland Kitchen MyChart Message (if you have MyChart) OR . A paper copy in the mail If you have any lab test that is abnormal or we need to change your treatment, we will call you to review the results.   Testing/Procedures: NONE  Follow-Up: At [facility], you and your health needs are our priority.  As part of our continuing mission to provide you with exceptional heart care, we have created designated Provider Care Teams.  These Care Teams include your primary Cardiologist (physician) and Advanced Practice Providers (APPs -  Physician Assistants and Nurse Practitioners) who all work together to provide you with the care you need, when you need it.  We recommend signing up for the patient portal called "MyChart".  Sign up information is provided on this After Visit Summary.  MyChart is used to connect with patients for Virtual Visits (Telemedicine).  Patients are able to view lab/test results, encounter notes, upcoming appointments, etc.  Non-urgent messages can be sent to your provider as well.   To learn more about what you can do with MyChart, go to [email or web address].    Your next appointment:   1 year(s)  The format for your next appointment:   In Person  Provider:   You may see Olga Millers, MD or one of the following Advanced Practice Providers on your designated Care Team:    Corine Shelter, PA-C  [place], [place]  Edd Fabian,  [place]    Other Instructions  IF YOUR BLOOD PRESSURE CONSISTENTLY RUNS OVER 130/80 AFTER STARTING YOU CARVEDILOL BACK PLEASE CALL OUR OFFICE TO LET us KNOW.

## 2021-02-07 ENCOUNTER — Other Ambulatory Visit: Payer: Self-pay | Admitting: Student

## 2021-05-30 ENCOUNTER — Other Ambulatory Visit: Payer: Self-pay | Admitting: Cardiology

## 2021-09-15 ENCOUNTER — Other Ambulatory Visit: Payer: Self-pay | Admitting: Student

## 2021-09-15 DIAGNOSIS — I10 Essential (primary) hypertension: Secondary | ICD-10-CM

## 2021-09-15 DIAGNOSIS — I5022 Chronic systolic (congestive) heart failure: Secondary | ICD-10-CM

## 2022-07-11 ENCOUNTER — Other Ambulatory Visit: Payer: Self-pay | Admitting: Family Medicine

## 2022-07-11 DIAGNOSIS — M799 Soft tissue disorder, unspecified: Secondary | ICD-10-CM

## 2022-08-02 ENCOUNTER — Ambulatory Visit
Admission: RE | Admit: 2022-08-02 | Discharge: 2022-08-02 | Disposition: A | Discharge status: Home / Self Care | Payer: Commercial Managed Care - HMO | Source: Ambulatory Visit | Attending: Family Medicine | Admitting: Family Medicine

## 2022-08-02 ENCOUNTER — Other Ambulatory Visit: Payer: Self-pay | Admitting: Family Medicine

## 2022-08-02 DIAGNOSIS — M799 Soft tissue disorder, unspecified: Secondary | ICD-10-CM

## 2022-10-20 DIAGNOSIS — Z1211 Encounter for screening for malignant neoplasm of colon: Secondary | ICD-10-CM | POA: Diagnosis not present

## 2022-10-20 DIAGNOSIS — K648 Other hemorrhoids: Secondary | ICD-10-CM | POA: Diagnosis not present

## 2022-11-28 DIAGNOSIS — L910 Hypertrophic scar: Secondary | ICD-10-CM | POA: Diagnosis not present

## 2023-01-02 DIAGNOSIS — L910 Hypertrophic scar: Secondary | ICD-10-CM | POA: Diagnosis not present

## 2023-01-02 DIAGNOSIS — L720 Epidermal cyst: Secondary | ICD-10-CM | POA: Diagnosis not present

## 2023-02-28 DIAGNOSIS — E1165 Type 2 diabetes mellitus with hyperglycemia: Secondary | ICD-10-CM | POA: Diagnosis not present

## 2023-02-28 DIAGNOSIS — I10 Essential (primary) hypertension: Secondary | ICD-10-CM | POA: Diagnosis not present

## 2023-02-28 DIAGNOSIS — Z6829 Body mass index (BMI) 29.0-29.9, adult: Secondary | ICD-10-CM | POA: Diagnosis not present

## 2023-02-28 DIAGNOSIS — M109 Gout, unspecified: Secondary | ICD-10-CM | POA: Diagnosis not present

## 2023-02-28 DIAGNOSIS — E782 Mixed hyperlipidemia: Secondary | ICD-10-CM | POA: Diagnosis not present
